# Patient Record
Sex: Female | Born: 1955 | Race: White | Hispanic: No | Marital: Married | Smoking: Never smoker
Health system: Southern US, Community
[De-identification: ages and names within clinical notes are randomized; demographics above are authoritative.]

## PROBLEM LIST (undated history)

## (undated) DIAGNOSIS — R011 Cardiac murmur, unspecified: Secondary | ICD-10-CM

## (undated) DIAGNOSIS — I10 Essential (primary) hypertension: Secondary | ICD-10-CM

## (undated) DIAGNOSIS — G4730 Sleep apnea, unspecified: Secondary | ICD-10-CM

## (undated) DIAGNOSIS — E119 Type 2 diabetes mellitus without complications: Secondary | ICD-10-CM

## (undated) DIAGNOSIS — R7303 Prediabetes: Secondary | ICD-10-CM

## (undated) DIAGNOSIS — K219 Gastro-esophageal reflux disease without esophagitis: Secondary | ICD-10-CM

## (undated) HISTORY — DX: Essential (primary) hypertension: I10

## (undated) HISTORY — PX: WISDOM TOOTH EXTRACTION: SHX21

## (undated) HISTORY — PX: OTHER SURGICAL HISTORY: SHX169

## (undated) HISTORY — PX: ABDOMINAL HYSTERECTOMY: SHX81

## (undated) HISTORY — PX: COLONOSCOPY: SHX174

---

## 1997-11-16 ENCOUNTER — Other Ambulatory Visit: Admission: RE | Admit: 1997-11-16 | Discharge: 1997-11-16 | Payer: Self-pay | Admitting: Obstetrics and Gynecology

## 1998-11-23 ENCOUNTER — Other Ambulatory Visit: Admission: RE | Admit: 1998-11-23 | Discharge: 1998-11-23 | Payer: Self-pay | Admitting: Obstetrics and Gynecology

## 1999-02-16 ENCOUNTER — Inpatient Hospital Stay (HOSPITAL_COMMUNITY): Admission: RE | Admit: 1999-02-16 | Discharge: 1999-02-21 | Payer: Self-pay | Admitting: Obstetrics and Gynecology

## 2000-03-05 ENCOUNTER — Encounter: Payer: Self-pay | Admitting: Obstetrics and Gynecology

## 2000-03-05 ENCOUNTER — Encounter: Admission: RE | Admit: 2000-03-05 | Discharge: 2000-03-05 | Payer: Self-pay | Admitting: Obstetrics and Gynecology

## 2000-03-05 ENCOUNTER — Other Ambulatory Visit: Admission: RE | Admit: 2000-03-05 | Discharge: 2000-03-05 | Payer: Self-pay | Admitting: Obstetrics and Gynecology

## 2001-03-25 ENCOUNTER — Encounter: Admission: RE | Admit: 2001-03-25 | Discharge: 2001-03-25 | Payer: Self-pay | Admitting: Obstetrics and Gynecology

## 2001-03-25 ENCOUNTER — Encounter: Payer: Self-pay | Admitting: Obstetrics and Gynecology

## 2001-03-25 ENCOUNTER — Other Ambulatory Visit: Admission: RE | Admit: 2001-03-25 | Discharge: 2001-03-25 | Payer: Self-pay | Admitting: Obstetrics and Gynecology

## 2002-03-26 ENCOUNTER — Encounter: Payer: Self-pay | Admitting: Obstetrics and Gynecology

## 2002-03-26 ENCOUNTER — Encounter: Admission: RE | Admit: 2002-03-26 | Discharge: 2002-03-26 | Payer: Self-pay | Admitting: Obstetrics and Gynecology

## 2002-04-22 ENCOUNTER — Other Ambulatory Visit: Admission: RE | Admit: 2002-04-22 | Discharge: 2002-04-22 | Payer: Self-pay | Admitting: Obstetrics and Gynecology

## 2003-05-04 ENCOUNTER — Ambulatory Visit (HOSPITAL_BASED_OUTPATIENT_CLINIC_OR_DEPARTMENT_OTHER): Admission: RE | Admit: 2003-05-04 | Discharge: 2003-05-04 | Payer: Self-pay | Admitting: Internal Medicine

## 2003-05-31 ENCOUNTER — Other Ambulatory Visit: Admission: RE | Admit: 2003-05-31 | Discharge: 2003-05-31 | Payer: Self-pay | Admitting: Obstetrics and Gynecology

## 2004-07-27 ENCOUNTER — Other Ambulatory Visit: Admission: RE | Admit: 2004-07-27 | Discharge: 2004-07-27 | Payer: Self-pay | Admitting: Obstetrics and Gynecology

## 2005-08-20 ENCOUNTER — Encounter: Admission: RE | Admit: 2005-08-20 | Discharge: 2005-08-20 | Payer: Self-pay | Admitting: Sports Medicine

## 2005-10-22 ENCOUNTER — Ambulatory Visit (HOSPITAL_COMMUNITY): Admission: RE | Admit: 2005-10-22 | Discharge: 2005-10-23 | Payer: Self-pay | Admitting: Orthopedic Surgery

## 2006-04-26 IMAGING — CR DG CHEST 2V
2 series · 2 of 2 positions shown · non-contrast
Comparison: none

CLINICAL DATA: Hypertension, impingement syndrome. 
 CHEST - 2 VIEW ? 10/17/05:
 The heart size and mediastinal contours are within normal limits.  Both lungs are clear.  The visualized skeletal structures are unremarkable.

[view not recorded (1 of 2)]
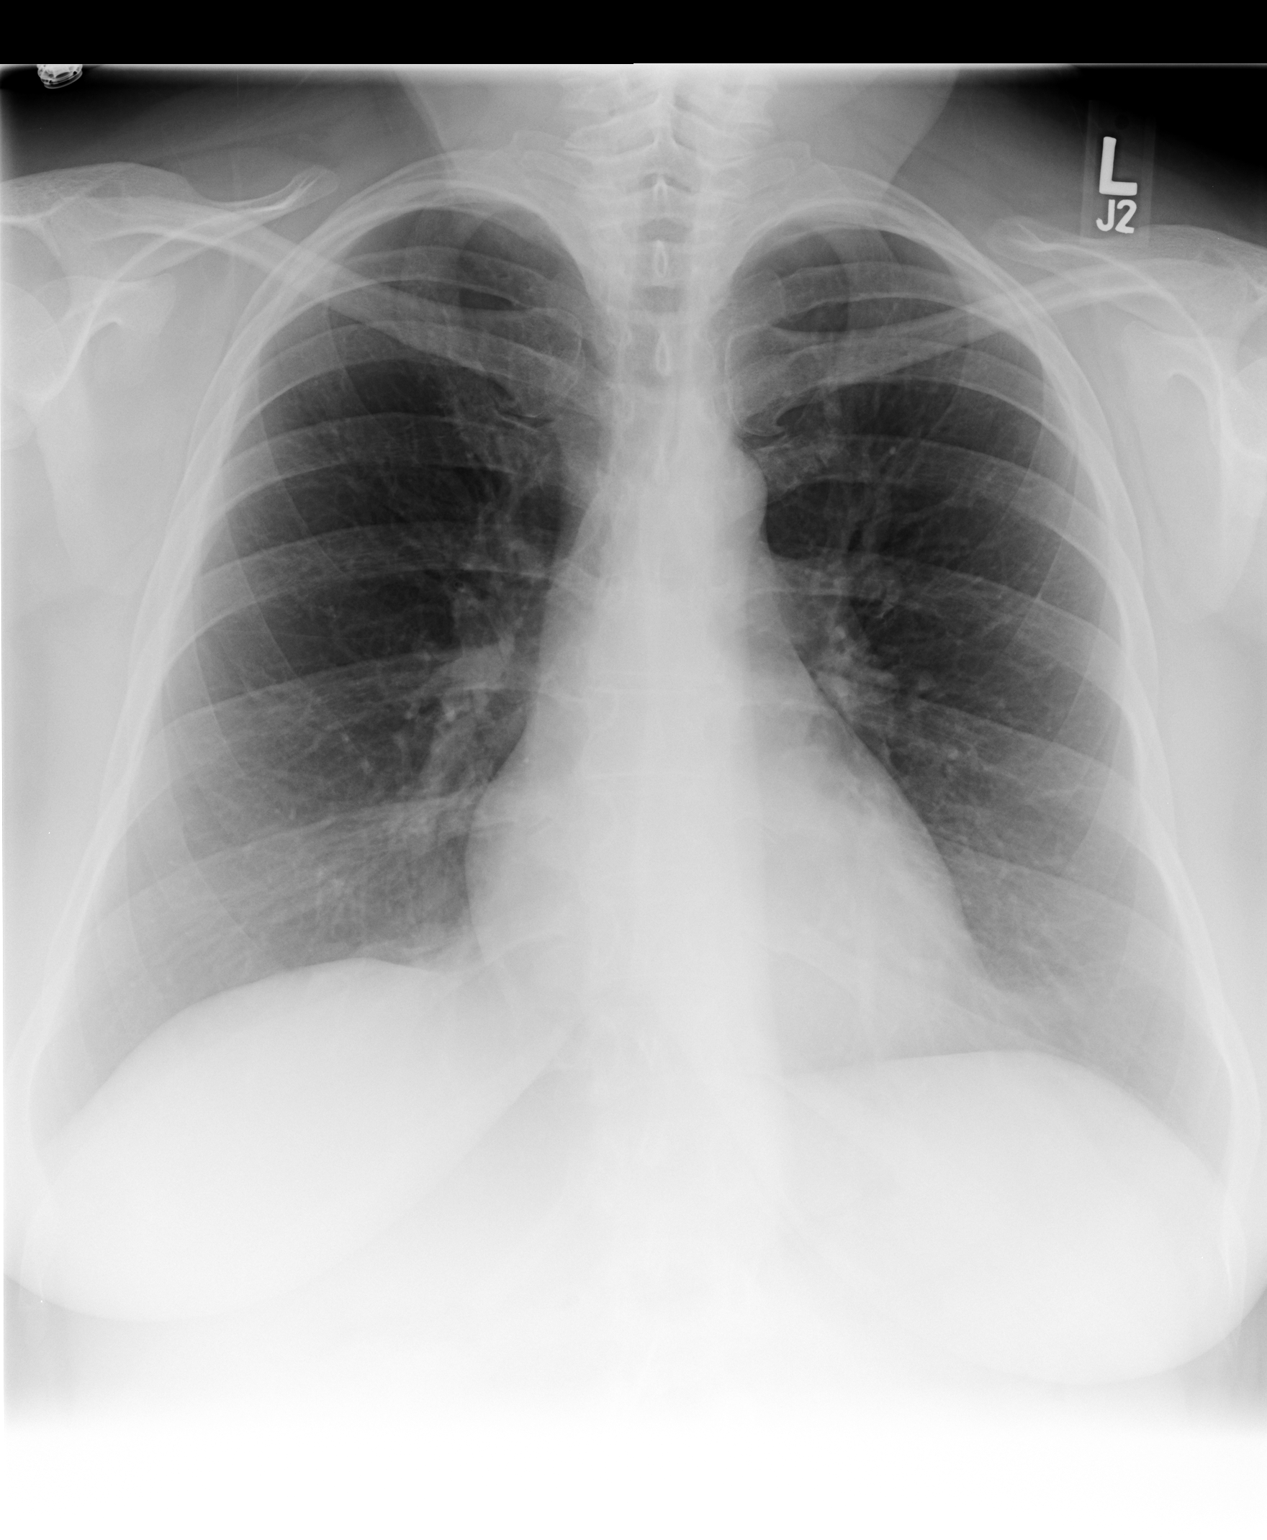

[view not recorded (2 of 2)]
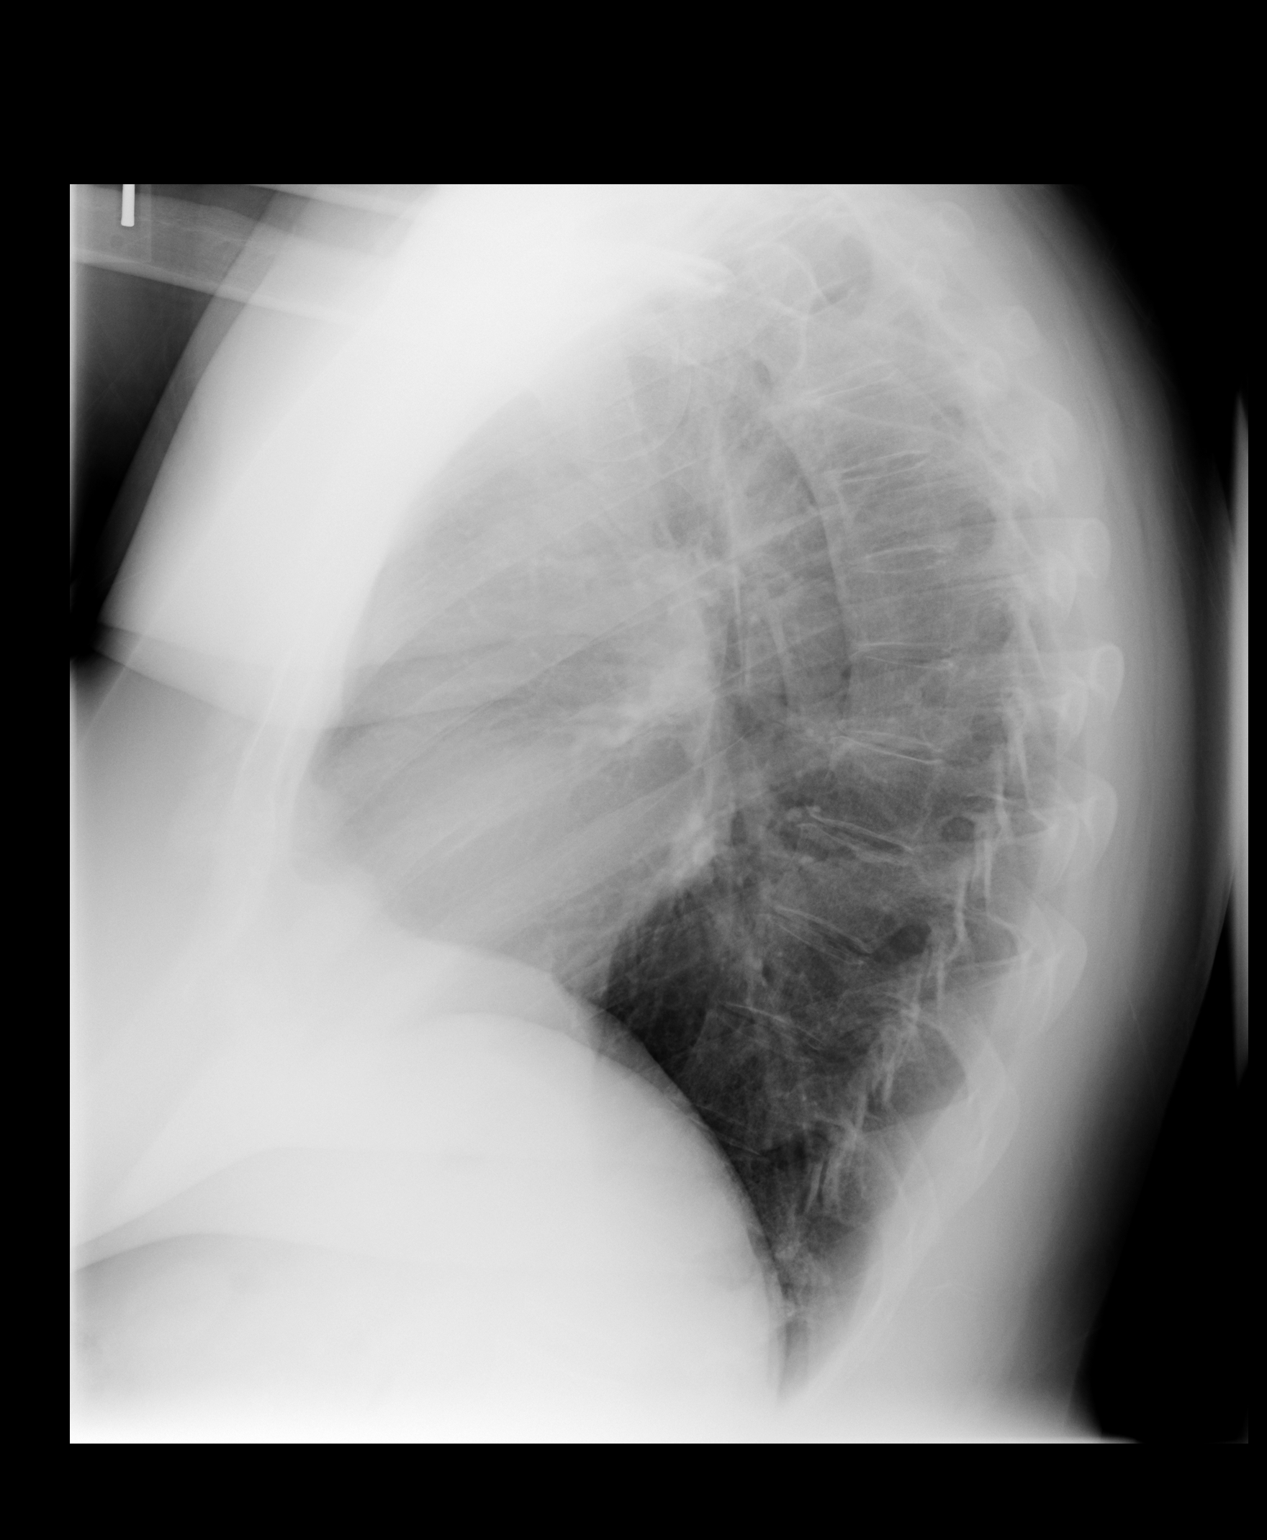

[2 of 2 positions shown; findings below may reference images not displayed]

IMPRESSION: No active cardiopulmonary disease.

## 2013-05-04 ENCOUNTER — Other Ambulatory Visit (HOSPITAL_COMMUNITY): Payer: Self-pay | Admitting: Obstetrics and Gynecology

## 2013-05-04 DIAGNOSIS — R011 Cardiac murmur, unspecified: Secondary | ICD-10-CM

## 2013-05-13 ENCOUNTER — Ambulatory Visit (HOSPITAL_COMMUNITY): Payer: Managed Care, Other (non HMO)

## 2016-05-29 ENCOUNTER — Other Ambulatory Visit: Payer: Self-pay | Admitting: Orthopedic Surgery

## 2016-07-04 ENCOUNTER — Encounter (HOSPITAL_BASED_OUTPATIENT_CLINIC_OR_DEPARTMENT_OTHER): Admission: RE | Payer: Self-pay | Source: Ambulatory Visit

## 2016-07-04 ENCOUNTER — Ambulatory Visit (HOSPITAL_BASED_OUTPATIENT_CLINIC_OR_DEPARTMENT_OTHER)
Admission: RE | Admit: 2016-07-04 | Payer: Managed Care, Other (non HMO) | Source: Ambulatory Visit | Admitting: Orthopedic Surgery

## 2016-07-04 SURGERY — EXCISION MASS
Anesthesia: Choice | Laterality: Left

## 2018-05-25 ENCOUNTER — Other Ambulatory Visit: Payer: Self-pay | Admitting: Psychiatry

## 2018-05-25 DIAGNOSIS — F900 Attention-deficit hyperactivity disorder, predominantly inattentive type: Secondary | ICD-10-CM

## 2018-05-25 MED ORDER — AMPHETAMINE-DEXTROAMPHET ER 25 MG PO CP24: ORAL_CAPSULE | ORAL | 0 refills | Status: DC

## 2018-05-25 NOTE — Progress Notes (Signed)
Adderall xr refill

## 2018-05-26 ENCOUNTER — Encounter: Payer: Self-pay | Admitting: Emergency Medicine

## 2018-05-26 DIAGNOSIS — F909 Attention-deficit hyperactivity disorder, unspecified type: Secondary | ICD-10-CM | POA: Insufficient documentation

## 2018-07-15 ENCOUNTER — Ambulatory Visit (INDEPENDENT_AMBULATORY_CARE_PROVIDER_SITE_OTHER): Payer: Managed Care, Other (non HMO) | Admitting: Psychiatry

## 2018-07-15 ENCOUNTER — Encounter: Payer: Self-pay | Admitting: Psychiatry

## 2018-07-15 VITALS — BP 197/78 | HR 86

## 2018-07-15 DIAGNOSIS — F3342 Major depressive disorder, recurrent, in full remission: Secondary | ICD-10-CM | POA: Diagnosis not present

## 2018-07-15 DIAGNOSIS — F900 Attention-deficit hyperactivity disorder, predominantly inattentive type: Secondary | ICD-10-CM | POA: Diagnosis not present

## 2018-07-15 MED ORDER — AMPHETAMINE-DEXTROAMPHET ER 25 MG PO CP24: ORAL_CAPSULE | ORAL | 0 refills | Status: DC

## 2018-07-15 MED ORDER — BUPROPION HCL ER (XL) 300 MG PO TB24: 300.0000 mg | ORAL_TABLET | ORAL | 1 refills | Status: DC

## 2018-07-15 MED ORDER — SERTRALINE HCL 25 MG PO TABS: 25.0000 mg | ORAL_TABLET | Freq: Every day | ORAL | 3 refills | Status: DC

## 2018-07-15 NOTE — Progress Notes (Signed)
Taylor Oconnell [id number] 01/08/1956 63 y.o.  Subjective:   Patient ID:  Taylor Oconnell is a 63 y.o. (DOB 05/16/1956) female.  Chief Complaint:  Chief Complaint  Patient presents with  . Follow-up    Medication Management    HPI  Last seen July 24 Taylor Oconnell presents to the office today for follow-up of ADD and depression.  "I think I've been fine"  But family complains vaguely about things like she's slow.  Do get side-tracked and is chronically slow too.  Taylor Oconnell says she's different perhaps less attentive.  Patient doesn't notice much change.  She doesn't have a schedule and that contributes to lower productivity.  Compliant with meds.  Recognizes she has lost hearing and that annoys family.  Of note last med change was added sertraline 25 mg in February for excessive tearfulness.    She's retired for 2 1/2 years. .   Review of Systems:  Review of Systems  Neurological: Negative for tremors and weakness.  Psychiatric/Behavioral: Positive for decreased concentration. Negative for agitation, behavioral problems, confusion, dysphoric mood, hallucinations, self-injury, sleep disturbance and suicidal ideas. The patient is not nervous/anxious and is not hyperactive.     Medications: I have reviewed the patient's current medications.  Current Outpatient Medications  Medication Sig Dispense Refill  . amphetamine-dextroamphetamine (ADDERALL XR) 25 MG 24 hr capsule 2 in the morning and 1 at noon 90 capsule 0  . Ascorbic Acid (VITAMIN C CR) 1000 MG TBCR Take by mouth.    Marland Kitchen buPROPion (WELLBUTRIN XL) 300 MG 24 hr tablet Take 300 mg by mouth every morning.    . Cetirizine HCl (ZYRTEC ALLERGY PO) Take by mouth.    . hydrochlorothiazide (HYDRODIURIL) 25 MG tablet     . pantoprazole (PROTONIX) 40 MG tablet     . ramipril (ALTACE) 5 MG capsule     . sertraline (ZOLOFT) 50 MG tablet Take 50 mg by mouth daily. 1/2 qd x 14 days, then 1 qd     No current facility-administered medications  for this visit.     Medication Side Effects: None  Allergies:  Allergies  Allergen Reactions  . Sulfamethoxazole Swelling    History reviewed. No pertinent past medical history.  Family History  Problem Relation Age of Onset  . Bipolar disorder Paternal Grandmother     Social History   Socioeconomic History  . Marital status: Married    Spouse name: Not on file  . Number of children: Not on file  . Years of education: Not on file  . Highest education level: Not on file  Occupational History  . Not on file  Social Needs  . Financial resource strain: Not on file  . Food insecurity:    Worry: Not on file    Inability: Not on file  . Transportation needs:    Medical: Not on file    Non-medical: Not on file  Tobacco Use  . Smoking status: Never Smoker  . Smokeless tobacco: Never Used  Substance and Sexual Activity  . Alcohol use: Not Currently  . Drug use: Never  . Sexual activity: Yes    Partners: Male    Birth control/protection: None  Lifestyle  . Physical activity:    Days per week: Not on file    Minutes per session: Not on file  . Stress: Not on file  Relationships  . Social connections:    Talks on phone: Not on file    Gets together: Not on file  Attends religious service: Not on file    Active member of club or organization: Not on file    Attends meetings of clubs or organizations: Not on file    Relationship status: Not on file  . Intimate partner violence:    Fear of current or ex partner: Not on file    Emotionally abused: Not on file    Physically abused: Not on file    Forced sexual activity: Not on file  Other Topics Concern  . Not on file  Social History Narrative  . Not on file    Past Medical History, Surgical history, Social history, and Family history were reviewed and updated as appropriate.   Please see review of systems for further details on the patient's review from today.   Objective:   Physical Exam:  BP (!) 197/78    Pulse 86   Physical Exam Constitutional:      General: She is not in acute distress.    Appearance: She is well-developed.  Musculoskeletal:        General: No deformity.  Neurological:     Mental Status: She is alert and oriented to person, place, and time.     Motor: No tremor.     Coordination: Coordination normal.     Gait: Gait normal.  Psychiatric:        Attention and Perception: Attention and perception normal.        Mood and Affect: Mood is not anxious or depressed. Affect is not labile, blunt, angry or inappropriate.        Speech: Speech normal.        Behavior: Behavior normal.        Thought Content: Thought content normal. Thought content does not include homicidal or suicidal ideation. Thought content does not include homicidal or suicidal plan.        Cognition and Memory: Cognition normal.        Judgment: Judgment normal.     Comments: Insight intact. No auditory or visual hallucinations. No delusions.      Lab Review:  No results found for: NA, K, CL, CO2, GLUCOSE, BUN, CREATININE, CALCIUM, PROT, ALBUMIN, AST, ALT, ALKPHOS, BILITOT, GFRNONAA, GFRAA  No results found for: WBC, RBC, HGB, HCT, PLT, MCV, MCH, MCHC, RDW, LYMPHSABS, MONOABS, EOSABS, BASOSABS  No results found for: POCLITH, LITHIUM   No results found for: PHENYTOIN, PHENOBARB, VALPROATE, CBMZ   .res Assessment: Plan:    Attention deficit hyperactivity disorder (ADHD), predominantly inattentive type  Recurrent major depression in full remission ([place])   Chronic ADD only marginally controlled but on max meds.   Has "white coat" hypertension per her usual. Discussed potential benefits, risks, and side effects of stimulants with patient to include increased heart rate, palpitations, insomnia, increased anxiety, increased irritability, or decreased appetite.  Instructed patient to contact office if experiencing any significant tolerability issues.  Get hearing checked and aids for mental health  reasons.  Excessive tearfulness helped with 25 mg sertraline.  Continue meds without changing.  FU 6 mo or sooner  Lynder Parents, MD, DFAPA   Lynder Parents, MD, DFAPA   Please see After Visit Summary for patient specific instructions.  No future appointments.  No orders of the defined types were placed in this encounter.     -------------------------------

## 2018-10-22 ENCOUNTER — Other Ambulatory Visit: Payer: Self-pay

## 2018-10-22 DIAGNOSIS — F900 Attention-deficit hyperactivity disorder, predominantly inattentive type: Secondary | ICD-10-CM

## 2018-10-22 MED ORDER — AMPHETAMINE-DEXTROAMPHET ER 25 MG PO CP24: ORAL_CAPSULE | ORAL | 0 refills | Status: DC

## 2018-10-22 NOTE — Telephone Encounter (Signed)
Perfect.  I hope the trick was helpful. Thanks.

## 2019-01-13 ENCOUNTER — Ambulatory Visit: Payer: Managed Care, Other (non HMO) | Admitting: Psychiatry

## 2019-01-21 ENCOUNTER — Other Ambulatory Visit: Payer: Self-pay | Admitting: Psychiatry

## 2019-01-21 DIAGNOSIS — F900 Attention-deficit hyperactivity disorder, predominantly inattentive type: Secondary | ICD-10-CM

## 2019-01-21 NOTE — Telephone Encounter (Signed)
Can you submit please  Last fill 12/21/2018 #[id number]  Has appt 01/27/2019 with [facility] Last appt 07/2018

## 2019-01-27 ENCOUNTER — Other Ambulatory Visit: Payer: Self-pay

## 2019-01-27 ENCOUNTER — Ambulatory Visit (INDEPENDENT_AMBULATORY_CARE_PROVIDER_SITE_OTHER): Payer: 59 | Admitting: Psychiatry

## 2019-01-27 ENCOUNTER — Encounter: Payer: Self-pay | Admitting: Psychiatry

## 2019-01-27 VITALS — BP 170/110 | HR 75

## 2019-01-27 DIAGNOSIS — F3342 Major depressive disorder, recurrent, in full remission: Secondary | ICD-10-CM | POA: Diagnosis not present

## 2019-01-27 DIAGNOSIS — F900 Attention-deficit hyperactivity disorder, predominantly inattentive type: Secondary | ICD-10-CM | POA: Diagnosis not present

## 2019-01-27 MED ORDER — SERTRALINE HCL 25 MG PO TABS: 25.0000 mg | ORAL_TABLET | Freq: Every day | ORAL | 3 refills | Status: DC

## 2019-01-27 MED ORDER — AMPHETAMINE-DEXTROAMPHET ER 25 MG PO CP24: ORAL_CAPSULE | ORAL | 0 refills | Status: DC

## 2019-01-27 MED ORDER — BUPROPION HCL ER (XL) 300 MG PO TB24: 300.0000 mg | ORAL_TABLET | ORAL | 3 refills | Status: DC

## 2019-01-27 NOTE — Progress Notes (Signed)
Taylor Oconnell [id number] 06-Feb-1956 63 y.o.  Subjective:   Patient ID:  Taylor Oconnell is a 63 y.o. (DOB 1956-01-21) female.  Chief Complaint:  Chief Complaint  Patient presents with  . ADHD    Medication Management  . Follow-up    Medication Management    HPI    Taylor Oconnell presents to the office today for follow-up of ADD and depression.  Last seen January 2020. No med changes.  Still doing well.  It helps that I'm a home-body re: Covid.  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  "I think I've been fine"  But family complains vaguely about things like she's slow.  Do get side-tracked and is chronically slow too.  [name] says she's different perhaps less attentive.  Patient doesn't notice much change.  She doesn't have a schedule and that contributes to lower productivity.  Compliant with meds.  Satisfied with meds.  Patient reports stable mood and denies depressed or irritable moods.  Patient denies any recent difficulty with anxiety.  Patient denies difficulty with sleep initiation or maintenance. Denies appetite disturbance.  Patient reports that energy and motivation have been good.  Patient denies any difficulty with concentration.  Patient denies any suicidal ideation.  [name]'s health is stable.  She still feels he needs to be on meds.  Get's irritable and angry to easily.  Recognizes she has lost hearing and that annoys family.  Of note last med change was added sertraline 25 mg in February 2019 for excessive tearfulness.  It helped.  She's retired for 2017. Loves it..  PE in June.  White coat elevation in BP.  Past Psychiatric Medication Trials: Wellbutrin XL 300, Adderall XR 75 daily, sertraline .   Review of Systems:  Review of Systems  Neurological: Negative for tremors and weakness.  Psychiatric/Behavioral: Positive for decreased concentration. Negative for agitation, behavioral problems, confusion, dysphoric mood, hallucinations, self-injury,  sleep disturbance and suicidal ideas. The patient is not nervous/anxious and is not hyperactive.     Medications: I have reviewed the patient's current medications.  Current Outpatient Medications  Medication Sig Dispense Refill  . amphetamine-dextroamphetamine (ADDERALL XR) 25 MG 24 hr capsule 2 in the AM and 1 In PM 90 capsule 0  . [START ON 02/24/2019] amphetamine-dextroamphetamine (ADDERALL XR) 25 MG 24 hr capsule 2 in the AM and 1 In PM 90 capsule 0  . [START ON 03/24/2019] amphetamine-dextroamphetamine (ADDERALL XR) 25 MG 24 hr capsule TAKE 2 CAPSULES BY MOUTH EVERY DAY IN THE MORNING AND TAKE ONE CAPSULE at noon 90 capsule 0  . Ascorbic Acid (VITAMIN C CR) 1000 MG TBCR Take by mouth.    Marland Kitchen buPROPion (WELLBUTRIN XL) 300 MG 24 hr tablet Take 1 tablet (300 mg total) by mouth every morning. 90 tablet 3  . Cetirizine HCl (ZYRTEC ALLERGY PO) Take by mouth.    . hydrochlorothiazide (HYDRODIURIL) 25 MG tablet     . pantoprazole (PROTONIX) 40 MG tablet     . ramipril (ALTACE) 5 MG capsule     . sertraline (ZOLOFT) 25 MG tablet Take 1 tablet (25 mg total) by mouth daily. 90 tablet 3   No current facility-administered medications for this visit.     Medication Side Effects: None  Allergies:  Allergies  Allergen Reactions  . Sulfamethoxazole Swelling    History reviewed. No pertinent past medical history.  Family History  Problem Relation Age of Onset  . Bipolar disorder Paternal Grandmother     Social History  Socioeconomic History  . Marital status: Married    Spouse name: Not on file  . Number of children: Not on file  . Years of education: Not on file  . Highest education level: Not on file  Occupational History  . Not on file  Social Needs  . Financial resource strain: Not on file  . Food insecurity    Worry: Not on file    Inability: Not on file  . Transportation needs    Medical: Not on file    Non-medical: Not on file  Tobacco Use  . Smoking status: Never  Smoker  . Smokeless tobacco: Never Used  Substance and Sexual Activity  . Alcohol use: Not Currently  . Drug use: Never  . Sexual activity: Yes    Partners: Male    Birth control/protection: None  Lifestyle  . Physical activity    Days per week: Not on file    Minutes per session: Not on file  . Stress: Not on file  Relationships  . Social Herbalist on phone: Not on file    Gets together: Not on file    Attends religious service: Not on file    Active member of club or organization: Not on file    Attends meetings of clubs or organizations: Not on file    Relationship status: Not on file  . Intimate partner violence    Fear of current or ex partner: Not on file    Emotionally abused: Not on file    Physically abused: Not on file    Forced sexual activity: Not on file  Other Topics Concern  . Not on file  Social History Narrative  . Not on file    Past Medical History, Surgical history, Social history, and Family history were reviewed and updated as appropriate.   Please see review of systems for further details on the patient's review from today.   Objective:   Physical Exam:  BP (!) 170/110   Pulse 75   Physical Exam Constitutional:      General: She is not in acute distress.    Appearance: She is well-developed.  Musculoskeletal:        General: No deformity.  Neurological:     Mental Status: She is alert and oriented to person, place, and time.     Motor: No tremor.     Coordination: Coordination normal.     Gait: Gait normal.  Psychiatric:        Attention and Perception: Attention and perception normal.        Mood and Affect: Mood is not anxious or depressed. Affect is not labile, blunt, angry or inappropriate.        Speech: Speech normal.        Behavior: Behavior normal.        Thought Content: Thought content normal. Thought content does not include homicidal or suicidal ideation. Thought content does not include homicidal or suicidal  plan.        Cognition and Memory: Cognition normal.        Judgment: Judgment normal.     Comments: Insight intact. No auditory or visual hallucinations. No delusions.      Lab Review:  No results found for: NA, K, CL, CO2, GLUCOSE, BUN, CREATININE, CALCIUM, PROT, ALBUMIN, AST, ALT, ALKPHOS, BILITOT, GFRNONAA, GFRAA  No results found for: WBC, RBC, HGB, HCT, PLT, MCV, MCH, MCHC, RDW, LYMPHSABS, MONOABS, EOSABS, BASOSABS  No results found for: POCLITH,  LITHIUM   No results found for: PHENYTOIN, PHENOBARB, VALPROATE, CBMZ   .res Assessment: Plan:    Analya was seen today for adhd and follow-up.  Diagnoses and all orders for this visit:  Recurrent major depression in full remission ([place]) -     buPROPion (WELLBUTRIN XL) 300 MG 24 hr tablet; Take 1 tablet (300 mg total) by mouth every morning.  Attention deficit hyperactivity disorder (ADHD), predominantly inattentive type -     amphetamine-dextroamphetamine (ADDERALL XR) 25 MG 24 hr capsule; 2 in the AM and 1 In PM -     amphetamine-dextroamphetamine (ADDERALL XR) 25 MG 24 hr capsule; 2 in the AM and 1 In PM -     amphetamine-dextroamphetamine (ADDERALL XR) 25 MG 24 hr capsule; TAKE 2 CAPSULES BY MOUTH EVERY DAY IN THE MORNING AND TAKE ONE CAPSULE at noon  Other orders -     sertraline (ZOLOFT) 25 MG tablet; Take 1 tablet (25 mg total) by mouth daily.   Chronic ADD only marginally controlled but on max meds.   Has "white coat" hypertension per her usual. Discussed potential benefits, risks, and side effects of stimulants with patient to include increased heart rate, palpitations, insomnia, increased anxiety, increased irritability, or decreased appetite.  Instructed patient to contact office if experiencing any significant tolerability issues.  Get hearing checked and aids for mental health reasons.  Excessive tearfulness helped with 25 mg sertraline.  Continue meds without changing.  FU 6 mo or sooner  Lynder Parents, MD,  DFAPA   Lynder Parents, MD, DFAPA   Please see After Visit Summary for patient specific instructions.  No future appointments.  No orders of the defined types were placed in this encounter.     -------------------------------

## 2019-02-22 ENCOUNTER — Other Ambulatory Visit: Payer: Self-pay | Admitting: Psychiatry

## 2019-02-22 DIAGNOSIS — F900 Attention-deficit hyperactivity disorder, predominantly inattentive type: Secondary | ICD-10-CM

## 2019-02-23 ENCOUNTER — Telehealth: Payer: Self-pay

## 2019-02-23 NOTE — Telephone Encounter (Signed)
Prior authorization submitted and approved for amphetamine-dextroamphetamine XR 25 mg 3/day through covermymeds with [employer] effective 02/23/2019-02/22/2022

## 2019-03-22 ENCOUNTER — Other Ambulatory Visit: Payer: Self-pay | Admitting: Psychiatry

## 2019-03-22 DIAGNOSIS — F900 Attention-deficit hyperactivity disorder, predominantly inattentive type: Secondary | ICD-10-CM

## 2019-04-22 ENCOUNTER — Other Ambulatory Visit: Payer: Self-pay | Admitting: Psychiatry

## 2019-04-22 DIAGNOSIS — F900 Attention-deficit hyperactivity disorder, predominantly inattentive type: Secondary | ICD-10-CM

## 2019-04-22 NOTE — Telephone Encounter (Signed)
07/2019 due back

## 2019-07-29 ENCOUNTER — Ambulatory Visit (INDEPENDENT_AMBULATORY_CARE_PROVIDER_SITE_OTHER): Payer: 59 | Admitting: Psychiatry

## 2019-07-29 ENCOUNTER — Other Ambulatory Visit: Payer: Self-pay

## 2019-07-29 ENCOUNTER — Encounter: Payer: Self-pay | Admitting: Psychiatry

## 2019-07-29 VITALS — BP 145/92 | HR 85

## 2019-07-29 DIAGNOSIS — F3342 Major depressive disorder, recurrent, in full remission: Secondary | ICD-10-CM

## 2019-07-29 DIAGNOSIS — F900 Attention-deficit hyperactivity disorder, predominantly inattentive type: Secondary | ICD-10-CM | POA: Diagnosis not present

## 2019-07-29 MED ORDER — AMPHETAMINE-DEXTROAMPHET ER 25 MG PO CP24: ORAL_CAPSULE | ORAL | 0 refills | Status: DC

## 2019-07-29 NOTE — Progress Notes (Addendum)
[name] [id number] 1955-08-20 64 y.o.  Subjective:   Patient ID:  Taylor Oconnell is a 64 y.o. (DOB [birth year]) female.  Chief Complaint:  Chief Complaint  Patient presents with  . Follow-up    Medication Management  . Depression    Medication Management  . ADHD    Medication Management    Depression        Associated symptoms include decreased concentration.  Associated symptoms include no suicidal ideas.     Taylor Oconnell presents to the office today for follow-up of ADD and depression.  Last seen July 2020. No med changes.  Still doing well.  It helps that I'm a home-body re: Covid. Still handles isolation fine.  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   "I think I've been fine"  But family complains vaguely about things like she's slow.  Do get side-tracked and is chronically slow too.  Taylor Oconnell says she's different perhaps less attentive.  Patient doesn't notice much change.  She doesn't have a schedule and that contributes to lower productivity.  Compliant with meds.  Satisfied with meds.  Patient reports stable mood and denies depressed or irritable moods.  Patient denies any recent difficulty with anxiety.  Patient denies difficulty with sleep initiation or maintenance. Denies appetite disturbance.  Patient reports that energy and motivation have been good.  Patient denies any difficulty with concentration.  Patient denies any suicidal ideation.  Taylor Oconnell's health is stable.  She still feels he needs to be on meds.  Get's irritable and angry to easily.   Pleased with 12# weight loss in a year. Recognizes she has lost hearing and that annoys family.  Of note last med change was added sertraline 25 mg in February 2019 for excessive tearfulness.  It helped.  When Taylor Oconnell is hateful can cry.    She's retired for 2017. Loves it..  PE in June.  White coat elevation in BP.  Past Psychiatric Medication Trials: Wellbutrin XL  300, Adderall XR 75 daily, sertraline .   Review of Systems:  Review of Systems  Neurological: Negative for tremors and weakness.  Psychiatric/Behavioral: Positive for decreased concentration and depression. Negative for agitation, behavioral problems, confusion, dysphoric mood, hallucinations, self-injury, sleep disturbance and suicidal ideas. The patient is not nervous/anxious and is not hyperactive.     Medications: I have reviewed the patient's current medications.  Current Outpatient Medications  Medication Sig Dispense Refill  . amphetamine-dextroamphetamine (ADDERALL XR) 25 MG 24 hr capsule 2 in the AM and 1 In PM 90 capsule 0  . [START ON 08/26/2019] amphetamine-dextroamphetamine (ADDERALL XR) 25 MG 24 hr capsule 2 in the AM and 1 In PM 90 capsule 0  . [START ON 09/23/2019] amphetamine-dextroamphetamine (ADDERALL XR) 25 MG 24 hr capsule TAKE 2 CAPSULES BY MOUTH EVERY DAY IN THE MORNING AND TAKE ONE CAPSULE AT NOON 90 capsule 0  . Ascorbic Acid (VITAMIN C CR) 1000 MG TBCR Take by mouth.    Marland Kitchen buPROPion (WELLBUTRIN XL) 300 MG 24 hr tablet Take 1 tablet (300 mg total) by mouth every morning. 90 tablet 3  . Cetirizine HCl (ZYRTEC ALLERGY PO) Take by mouth.    . hydrochlorothiazide (HYDRODIURIL) 25 MG tablet     . oxybutynin (DITROPAN-XL) 5 MG 24 hr tablet Take 5 mg by mouth daily.    . ramipril (ALTACE) 5 MG capsule 10 mg.     . sertraline (ZOLOFT) 25 MG tablet Take 1 tablet (  25 mg total) by mouth daily. 90 tablet 3   No current facility-administered medications for this visit.    Medication Side Effects: None  Allergies:  Allergies  Allergen Reactions  . Sulfamethoxazole Swelling    History reviewed. No pertinent past medical history.  Family History  Problem Relation Age of Onset  . Bipolar disorder Paternal Grandmother     Social History   Socioeconomic History  . Marital status: Married    Spouse name: Not on file  . Number of children: Not on file  . Years of  education: Not on file  . Highest education level: Not on file  Occupational History  . Not on file  Tobacco Use  . Smoking status: Never Smoker  . Smokeless tobacco: Never Used  Substance and Sexual Activity  . Alcohol use: Not Currently  . Drug use: Never  . Sexual activity: Yes    Partners: Male    Birth control/protection: None  Other Topics Concern  . Not on file  Social History Narrative  . Not on file   Social Determinants of Health   Financial Resource Strain:   . Difficulty of Paying Living Expenses: Not on file  Food Insecurity:   . Worried About Charity fundraiser in the Last Year: Not on file  . Ran Out of Food in the Last Year: Not on file  Transportation Needs:   . Lack of Transportation (Medical): Not on file  . Lack of Transportation (Non-Medical): Not on file  Physical Activity:   . Days of Exercise per Week: Not on file  . Minutes of Exercise per Session: Not on file  Stress:   . Feeling of Stress : Not on file  Social Connections:   . Frequency of Communication with Friends and Family: Not on file  . Frequency of Social Gatherings with Friends and Family: Not on file  . Attends Religious Services: Not on file  . Active Member of Clubs or Organizations: Not on file  . Attends Archivist Meetings: Not on file  . Marital Status: Not on file  Intimate Partner Violence:   . Fear of Current or Ex-Partner: Not on file  . Emotionally Abused: Not on file  . Physically Abused: Not on file  . Sexually Abused: Not on file    Past Medical History, Surgical history, Social history, and Family history were reviewed and updated as appropriate.   Please see review of systems for further details on the patient's review from today.   Objective:   Physical Exam:  BP (!) 145/92   Pulse 85   Physical Exam Constitutional:      General: She is not in acute distress.    Appearance: She is well-developed.  Musculoskeletal:        General: No  deformity.  Neurological:     Mental Status: She is alert and oriented to person, place, and time.     Motor: No tremor.     Coordination: Coordination normal.     Gait: Gait normal.  Psychiatric:        Attention and Perception: Attention and perception normal.        Mood and Affect: Mood is not anxious or depressed. Affect is not labile, blunt, angry or inappropriate.        Speech: Speech normal.        Behavior: Behavior normal.        Thought Content: Thought content normal. Thought content does not include homicidal or  suicidal ideation. Thought content does not include homicidal or suicidal plan.        Cognition and Memory: Cognition normal.        Judgment: Judgment normal.     Comments: Insight intact. No auditory or visual hallucinations. No delusions.      Lab Review:  No results found for: NA, K, CL, CO2, GLUCOSE, BUN, CREATININE, CALCIUM, PROT, ALBUMIN, AST, ALT, ALKPHOS, BILITOT, GFRNONAA, GFRAA  No results found for: WBC, RBC, HGB, HCT, PLT, MCV, MCH, MCHC, RDW, LYMPHSABS, MONOABS, EOSABS, BASOSABS  No results found for: POCLITH, LITHIUM   No results found for: PHENYTOIN, PHENOBARB, VALPROATE, CBMZ   .res Assessment: Plan:    Illene was seen today for follow-up, depression and adhd.  Diagnoses and all orders for this visit:  Recurrent major depression in full remission ([place])  Attention deficit hyperactivity disorder (ADHD), predominantly inattentive type -     amphetamine-dextroamphetamine (ADDERALL XR) 25 MG 24 hr capsule; 2 in the AM and 1 In PM -     amphetamine-dextroamphetamine (ADDERALL XR) 25 MG 24 hr capsule; 2 in the AM and 1 In PM -     amphetamine-dextroamphetamine (ADDERALL XR) 25 MG 24 hr capsule; TAKE 2 CAPSULES BY MOUTH EVERY DAY IN THE MORNING AND TAKE ONE CAPSULE AT NOON   Chronic ADD only marginally controlled but on max meds.  And is retired now so less demands.  Has "white coat" hypertension per her usual. Discussed potential benefits,  risks, and side effects of stimulants with patient to include increased heart rate, palpitations, insomnia, increased anxiety, increased irritability, or decreased appetite.  Instructed patient to contact office if experiencing any significant tolerability issues.  Got hearing checked and aids for mental health reasons.  It's helped.  Excessive tearfulness helped with 25 mg sertraline.  Continue meds without changing. Adderall XR 75 daily Wellbutrin XL 300 AM Sertraline 25 daily  FU 6 mo or sooner  Lynder Parents, MD, DFAPA   Lynder Parents, MD, DFAPA   Please see After Visit Summary for patient specific instructions.  No future appointments.  No orders of the defined types were placed in this encounter.     -------------------------------

## 2019-10-18 ENCOUNTER — Other Ambulatory Visit: Payer: Self-pay | Admitting: Psychiatry

## 2019-10-18 DIAGNOSIS — F900 Attention-deficit hyperactivity disorder, predominantly inattentive type: Secondary | ICD-10-CM

## 2019-10-18 NOTE — Telephone Encounter (Signed)
Refill due 04/17 Patient follow up in July

## 2019-11-17 ENCOUNTER — Other Ambulatory Visit: Payer: Self-pay | Admitting: Psychiatry

## 2019-11-17 DIAGNOSIS — F900 Attention-deficit hyperactivity disorder, predominantly inattentive type: Secondary | ICD-10-CM

## 2019-11-17 NOTE — Telephone Encounter (Signed)
Next apt 07/21

## 2019-12-17 ENCOUNTER — Other Ambulatory Visit: Payer: Self-pay | Admitting: Psychiatry

## 2019-12-17 DIAGNOSIS — F900 Attention-deficit hyperactivity disorder, predominantly inattentive type: Secondary | ICD-10-CM

## 2019-12-17 NOTE — Telephone Encounter (Signed)
Next apt 07/21

## 2020-01-14 ENCOUNTER — Other Ambulatory Visit: Payer: Self-pay | Admitting: Psychiatry

## 2020-01-14 DIAGNOSIS — F900 Attention-deficit hyperactivity disorder, predominantly inattentive type: Secondary | ICD-10-CM

## 2020-01-14 NOTE — Telephone Encounter (Signed)
Next apt 01/26/2020 with [facility]

## 2020-01-26 ENCOUNTER — Ambulatory Visit (INDEPENDENT_AMBULATORY_CARE_PROVIDER_SITE_OTHER): Payer: 59 | Admitting: Psychiatry

## 2020-01-26 ENCOUNTER — Encounter: Payer: Self-pay | Admitting: Psychiatry

## 2020-01-26 ENCOUNTER — Ambulatory Visit: Payer: 59 | Admitting: Psychiatry

## 2020-01-26 ENCOUNTER — Other Ambulatory Visit: Payer: Self-pay

## 2020-01-26 DIAGNOSIS — F900 Attention-deficit hyperactivity disorder, predominantly inattentive type: Secondary | ICD-10-CM | POA: Diagnosis not present

## 2020-01-26 DIAGNOSIS — F3342 Major depressive disorder, recurrent, in full remission: Secondary | ICD-10-CM | POA: Diagnosis not present

## 2020-01-26 MED ORDER — AMPHETAMINE-DEXTROAMPHET ER 25 MG PO CP24: ORAL_CAPSULE | ORAL | 0 refills | Status: DC

## 2020-01-26 MED ORDER — SERTRALINE HCL 25 MG PO TABS: 25.0000 mg | ORAL_TABLET | Freq: Every day | ORAL | 3 refills | Status: DC

## 2020-01-26 MED ORDER — BUPROPION HCL ER (XL) 300 MG PO TB24: 300.0000 mg | ORAL_TABLET | ORAL | 3 refills | Status: DC

## 2020-01-26 NOTE — Progress Notes (Signed)
Taylor Oconnell [id number] 02/08/1956 64 y.o.  Subjective:   Patient ID:  Taylor Oconnell is a 64 y.o. (DOB [birth year]) female.  Chief Complaint:  No chief complaint on file.   Depression        Associated symptoms include decreased concentration.  Associated symptoms include no suicidal ideas.     Taylor Oconnell presents to the office today for follow-up of ADD and depression.  Last seen Jan 2021. No med changes. On the following meds: Continue meds without changing. Adderall XR 75 daily Wellbutrin XL 300 AM Sertraline 25 daily  01/26/20 appt with the following noted: Retired 4 years.  Stays occupied.   Still doing well.  It helps that I'm a home-body re: Covid. Still handles isolation fine.  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   "I think I've been fine"  But family complains vaguely about things like she's slow.  Do get side-tracked and is chronically slow too.  [name] says she's different perhaps less attentive.  Patient doesn't notice much change.  She doesn't have a schedule and that contributes to lower productivity.  Compliant with meds.  Satisfied with meds.   Patient reports stable mood and denies depressed or irritable moods.  Patient denies any recent difficulty with anxiety.  Patient denies difficulty with sleep initiation or maintenance. Denies appetite disturbance.  Patient reports that energy and motivation have been good.  Patient denies any difficulty with concentration.  Patient denies any suicidal ideation.  [name]'s health is stable.  She still feels he needs to be on meds.  Get's irritable and angry to easily.   Of note last med change was added sertraline 25 mg in February 2019 for excessive tearfulness.  It helped.  When [name] is hateful can cry.    She's retired for 2017. Loves it..  Tendency to white coat syndrome.  PE and increased BP med and she has readings that are within the normal range.  Past  Psychiatric Medication Trials: Wellbutrin XL 300, Adderall XR 75 daily, sertraline .   Review of Systems:  Review of Systems  Neurological: Negative for tremors and weakness.  Psychiatric/Behavioral: Positive for decreased concentration and depression. Negative for agitation, behavioral problems, confusion, dysphoric mood, hallucinations, self-injury, sleep disturbance and suicidal ideas. The patient is not nervous/anxious and is not hyperactive.   Depression under control.  Medications: I have reviewed the patient's current medications.  Current Outpatient Medications  Medication Sig Dispense Refill  . amphetamine-dextroamphetamine (ADDERALL XR) 25 MG 24 hr capsule 2 in the AM and 1 In PM 90 capsule 0  . [START ON 02/23/2020] amphetamine-dextroamphetamine (ADDERALL XR) 25 MG 24 hr capsule 2 in the AM and 1 In PM 90 capsule 0  . [START ON 03/22/2020] amphetamine-dextroamphetamine (ADDERALL XR) 25 MG 24 hr capsule TAKE 2 CAPSULES BY MOUTH EVERY DAY IN THE MORNING AND TAKE ONE CAPSULE AT NOON 90 capsule 0  . Ascorbic Acid (VITAMIN C CR) 1000 MG TBCR Take by mouth.    Marland Kitchen buPROPion (WELLBUTRIN XL) 300 MG 24 hr tablet Take 1 tablet (300 mg total) by mouth every morning. 90 tablet 3  . Cetirizine HCl (ZYRTEC ALLERGY PO) Take by mouth.    . hydrochlorothiazide (HYDRODIURIL) 25 MG tablet     . oxybutynin (DITROPAN-XL) 5 MG 24 hr tablet Take 5 mg by mouth daily.    . ramipril (ALTACE) 5 MG capsule 10 mg.     . sertraline (ZOLOFT) 25 MG  tablet Take 1 tablet (25 mg total) by mouth daily. 90 tablet 3   No current facility-administered medications for this visit.    Medication Side Effects: None  Allergies:  Allergies  Allergen Reactions  . Sulfamethoxazole Swelling    History reviewed. No pertinent past medical history.  Family History  Problem Relation Age of Onset  . Bipolar disorder Paternal Grandmother     Social History   Socioeconomic History  . Marital status: Married    Spouse  name: Not on file  . Number of children: Not on file  . Years of education: Not on file  . Highest education level: Not on file  Occupational History  . Not on file  Tobacco Use  . Smoking status: Never Smoker  . Smokeless tobacco: Never Used  Substance and Sexual Activity  . Alcohol use: Not Currently  . Drug use: Never  . Sexual activity: Yes    Partners: Male    Birth control/protection: None  Other Topics Concern  . Not on file  Social History Narrative  . Not on file   Social Determinants of Health   Financial Resource Strain:   . Difficulty of Paying Living Expenses:   Food Insecurity:   . Worried About Charity fundraiser in the Last Year:   . Arboriculturist in the Last Year:   Transportation Needs:   . Film/video editor (Medical):   Marland Kitchen Lack of Transportation (Non-Medical):   Physical Activity:   . Days of Exercise per Week:   . Minutes of Exercise per Session:   Stress:   . Feeling of Stress :   Social Connections:   . Frequency of Communication with Friends and Family:   . Frequency of Social Gatherings with Friends and Family:   . Attends Religious Services:   . Active Member of Clubs or Organizations:   . Attends Archivist Meetings:   Marland Kitchen Marital Status:   Intimate Partner Violence:   . Fear of Current or Ex-Partner:   . Emotionally Abused:   Marland Kitchen Physically Abused:   . Sexually Abused:     Past Medical History, Surgical history, Social history, and Family history were reviewed and updated as appropriate.   Please see review of systems for further details on the patient's review from today.   Objective:   Physical Exam:  There were no vitals taken for this visit.  Physical Exam Constitutional:      General: She is not in acute distress.    Appearance: She is well-developed.  Musculoskeletal:        General: No deformity.  Neurological:     Mental Status: She is alert and oriented to person, place, and time.     Motor: No tremor.      Coordination: Coordination normal.     Gait: Gait normal.  Psychiatric:        Attention and Perception: Attention and perception normal.        Mood and Affect: Mood is depressed. Mood is not anxious. Affect is not labile, blunt, angry or inappropriate.        Speech: Speech normal.        Behavior: Behavior normal.        Thought Content: Thought content normal. Thought content does not include homicidal or suicidal ideation. Thought content does not include homicidal or suicidal plan.        Cognition and Memory: Cognition normal.        Judgment:  Judgment normal.     Comments: Insight intact. No auditory or visual hallucinations. No delusions.      Lab Review:  No results found for: NA, K, CL, CO2, GLUCOSE, BUN, CREATININE, CALCIUM, PROT, ALBUMIN, AST, ALT, ALKPHOS, BILITOT, GFRNONAA, GFRAA  No results found for: WBC, RBC, HGB, HCT, PLT, MCV, MCH, MCHC, RDW, LYMPHSABS, MONOABS, EOSABS, BASOSABS  No results found for: POCLITH, LITHIUM   No results found for: PHENYTOIN, PHENOBARB, VALPROATE, CBMZ   .res Assessment: Plan:    Diagnoses and all orders for this visit:  Recurrent major depression in full remission ([place]) -     buPROPion (WELLBUTRIN XL) 300 MG 24 hr tablet; Take 1 tablet (300 mg total) by mouth every morning.  Attention deficit hyperactivity disorder (ADHD), predominantly inattentive type -     amphetamine-dextroamphetamine (ADDERALL XR) 25 MG 24 hr capsule; 2 in the AM and 1 In PM -     amphetamine-dextroamphetamine (ADDERALL XR) 25 MG 24 hr capsule; 2 in the AM and 1 In PM -     amphetamine-dextroamphetamine (ADDERALL XR) 25 MG 24 hr capsule; TAKE 2 CAPSULES BY MOUTH EVERY DAY IN THE MORNING AND TAKE ONE CAPSULE AT NOON  Other orders -     sertraline (ZOLOFT) 25 MG tablet; Take 1 tablet (25 mg total) by mouth daily.   Chronic ADD only marginally controlled but on max meds.  And is retired now so less demands.  BP controlled.   Discussed potential  benefits, risks, and side effects of stimulants with patient to include increased heart rate, palpitations, insomnia, increased anxiety, increased irritability, or decreased appetite.  Instructed patient to contact office if experiencing any significant tolerability issues.  Got hearing checked and aids for mental health reasons.  It's helped.  Excessive tearfulness helped with 25 mg sertraline.  Continue meds without changing. Adderall XR 75 daily Wellbutrin XL 300 AM Sertraline 25 daily  Option Adzenys for expense if needed.  FU 6 mo or sooner  Lynder Parents, MD, DFAPA   Lynder Parents, MD, DFAPA   Please see After Visit Summary for patient specific instructions.  No future appointments.  No orders of the defined types were placed in this encounter.     -------------------------------

## 2020-05-01 ENCOUNTER — Ambulatory Visit (INDEPENDENT_AMBULATORY_CARE_PROVIDER_SITE_OTHER): Payer: Managed Care, Other (non HMO) | Admitting: Podiatry

## 2020-05-01 ENCOUNTER — Other Ambulatory Visit: Payer: Self-pay

## 2020-05-01 DIAGNOSIS — Z5329 Procedure and treatment not carried out because of patient's decision for other reasons: Secondary | ICD-10-CM

## 2020-05-01 NOTE — Progress Notes (Signed)
No show for appt. 

## 2020-05-08 ENCOUNTER — Encounter: Payer: Self-pay | Admitting: Podiatry

## 2020-05-08 ENCOUNTER — Ambulatory Visit: Payer: Managed Care, Other (non HMO) | Admitting: Podiatry

## 2020-05-08 ENCOUNTER — Other Ambulatory Visit: Payer: Self-pay

## 2020-05-08 DIAGNOSIS — L603 Nail dystrophy: Secondary | ICD-10-CM

## 2020-05-08 DIAGNOSIS — M79676 Pain in unspecified toe(s): Secondary | ICD-10-CM | POA: Diagnosis not present

## 2020-05-08 NOTE — Progress Notes (Signed)
  Subjective:  Patient ID: Taylor Oconnell, female    DOB: [birth year],  MRN: [id number]  Chief Complaint  Patient presents with  . Nail Problem    i stumped my left big toe about 6 months ago and does have some discoloration to it    65 y.o. female presents with the above complaint. History confirmed with patient  Objective:  Physical Exam: warm, good capillary refill, no trophic changes or ulcerative lesions, normal DP and PT pulses and normal sensory exam. 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 Distal nail lysis.    Assessment:   1. Nail dystrophy   2. Pain around toenail     Plan:  Patient was evaluated and treated and all questions answered.  Nail Dystrophy -Educated on likely traumatic etiology -Nail debrided courtesy  No follow-ups on file.

## 2020-05-12 ENCOUNTER — Other Ambulatory Visit: Payer: Self-pay | Admitting: Psychiatry

## 2020-05-12 DIAGNOSIS — F900 Attention-deficit hyperactivity disorder, predominantly inattentive type: Secondary | ICD-10-CM

## 2020-06-12 ENCOUNTER — Ambulatory Visit: Payer: Managed Care, Other (non HMO) | Admitting: Podiatry

## 2020-06-14 ENCOUNTER — Other Ambulatory Visit: Payer: Self-pay | Admitting: Psychiatry

## 2020-06-14 DIAGNOSIS — F900 Attention-deficit hyperactivity disorder, predominantly inattentive type: Secondary | ICD-10-CM

## 2020-06-14 NOTE — Telephone Encounter (Signed)
Next apt 01/20

## 2020-07-13 ENCOUNTER — Other Ambulatory Visit: Payer: Self-pay | Admitting: Psychiatry

## 2020-07-13 DIAGNOSIS — F900 Attention-deficit hyperactivity disorder, predominantly inattentive type: Secondary | ICD-10-CM

## 2020-07-27 ENCOUNTER — Ambulatory Visit (INDEPENDENT_AMBULATORY_CARE_PROVIDER_SITE_OTHER): Payer: 59 | Admitting: Psychiatry

## 2020-07-27 ENCOUNTER — Other Ambulatory Visit: Payer: Self-pay

## 2020-07-27 ENCOUNTER — Encounter: Payer: Self-pay | Admitting: Psychiatry

## 2020-07-27 DIAGNOSIS — F3341 Major depressive disorder, recurrent, in partial remission: Secondary | ICD-10-CM | POA: Diagnosis not present

## 2020-07-27 DIAGNOSIS — F900 Attention-deficit hyperactivity disorder, predominantly inattentive type: Secondary | ICD-10-CM | POA: Diagnosis not present

## 2020-07-27 MED ORDER — SERTRALINE HCL 50 MG PO TABS: 50.0000 mg | ORAL_TABLET | Freq: Every day | ORAL | 1 refills | Status: DC

## 2020-07-27 NOTE — Progress Notes (Signed)
Taylor Oconnell [id number] 09-Oct-1955 65 y.o.  Subjective:   Patient ID:  Taylor Oconnell is a 65 y.o. (DOB 01-26-1956) female.  Chief Complaint:  Chief Complaint  Patient presents with  . Follow-up  . Anxiety  . ADHD    Depression        Associated symptoms include decreased concentration.  Associated symptoms include no suicidal ideas.     Taylor Oconnell presents to the office today for follow-up of ADD and depression.  Last seen Jan 2021. No med changes. On the following meds: Continue meds without changing. Adderall XR 75 daily Wellbutrin XL 300 AM Sertraline 25 daily  01/26/20 appt with the following noted: Retired 4 years.  Stays occupied.   Still doing well.  It helps that I'm a home-body re: Covid. Still handles isolation fine.  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   "I think I've been fine"  But family complains vaguely about things like she's slow.  Do get side-tracked and is chronically slow too.  Taylor Oconnell says she's different perhaps less attentive.  Patient doesn't notice much change.  She doesn't have a schedule and that contributes to lower productivity.  Compliant with meds.  Satisfied with meds.  Plan: Continue meds without changing. Adderall XR 75 daily Wellbutrin XL 300 AM Sertraline 25 daily Wellbutrin XL 300 AM  07/27/2020 appointment with the following noted:  More stress that Taylor Oconnell's BF cheated and she moved back in.  She and Taylor Oconnell butt head leading to more stress.  Pt increased sertraline in the last 6 weeks DT this stress.  Then pt cried more and this is better controlled on 50 mg sertraline. She'll build a house behind them and so will be there for a year.    Patient reports stable mood and denies depressed or irritable moods.  Patient denies difficulty with sleep initiation or maintenance. Denies appetite disturbance.  Patient reports that energy and motivation have been good.  Patient denies any  difficulty with concentration.  Patient denies any suicidal ideation.  Taylor Oconnell's health is stable.  She still feels he needs to be on meds.  Get's irritable and angry to easily.   Of note last med change was added sertraline 25 mg in February 2019 for excessive tearfulness.  It helped until recently as noted.  When Taylor Oconnell is hateful can cry.    She's retired for 2017. Loves it..  Tendency to white coat syndrome.  PE and increased BP med and she has readings that are within the normal range.  Past Psychiatric Medication Trials: Wellbutrin XL 300, Adderall XR 75 daily, sertraline 50 .   Review of Systems:  Review of Systems  Cardiovascular: Negative for palpitations.  Neurological: Negative for tremors and weakness.  Psychiatric/Behavioral: Positive for decreased concentration and depression. Negative for agitation, behavioral problems, confusion, dysphoric mood, hallucinations, self-injury, sleep disturbance and suicidal ideas. The patient is not nervous/anxious and is not hyperactive.   Depression under control.  Medications: I have reviewed the patient's current medications.  Current Outpatient Medications  Medication Sig Dispense Refill  . amphetamine-dextroamphetamine (ADDERALL XR) 25 MG 24 hr capsule TAKE 2 CAPSULES BY MOUTH EVERY DAY IN THE MORNING AND TAKE ONE CAPSULE AT NOON 90 capsule 0  . Ascorbic Acid (VITAMIN C CR) 1000 MG TBCR Take by mouth.    Marland Kitchen buPROPion (WELLBUTRIN XL) 300 MG 24 hr tablet Take 1 tablet (300 mg total) by mouth every morning. 90 tablet 3  .  Cetirizine HCl (ZYRTEC ALLERGY PO) Take by mouth.    . hydrochlorothiazide (HYDRODIURIL) 25 MG tablet     . oxybutynin (DITROPAN-XL) 5 MG 24 hr tablet Take 5 mg by mouth daily.    . ramipril (ALTACE) 10 MG capsule Take 10 mg by mouth daily.    . sertraline (ZOLOFT) 50 MG tablet Take 1 tablet (50 mg total) by mouth daily. 90 tablet 1   No current facility-administered medications for this visit.    Medication Side  Effects: None  Allergies:  Allergies  Allergen Reactions  . Sulfamethoxazole Swelling    History reviewed. No pertinent past medical history.  Family History  Problem Relation Age of Onset  . Bipolar disorder Paternal Grandmother     Social History   Socioeconomic History  . Marital status: Married    Spouse name: Not on file  . Number of children: Not on file  . Years of education: Not on file  . Highest education level: Not on file  Occupational History  . Not on file  Tobacco Use  . Smoking status: Never Smoker  . Smokeless tobacco: Never Used  Substance and Sexual Activity  . Alcohol use: Not Currently  . Drug use: Never  . Sexual activity: Yes    Partners: Male    Birth control/protection: None  Other Topics Concern  . Not on file  Social History Narrative  . Not on file   Social Determinants of Health   Financial Resource Strain: Not on file  Food Insecurity: Not on file  Transportation Needs: Not on file  Physical Activity: Not on file  Stress: Not on file  Social Connections: Not on file  Intimate Partner Violence: Not on file    Past Medical History, Surgical history, Social history, and Family history were reviewed and updated as appropriate.   Please see review of systems for further details on the patient's review from today.   Objective:   Physical Exam:  There were no vitals taken for this visit.  Physical Exam Constitutional:      General: She is not in acute distress.    Appearance: She is well-developed.  Musculoskeletal:        General: No deformity.  Neurological:     Mental Status: She is alert and oriented to person, place, and time.     Motor: No tremor.     Coordination: Coordination normal.     Gait: Gait normal.  Psychiatric:        Attention and Perception: Attention and perception normal.        Mood and Affect: Mood is depressed. Mood is not anxious. Affect is not labile, blunt, angry or inappropriate.        Speech:  Speech normal.        Behavior: Behavior normal.        Thought Content: Thought content normal. Thought content does not include homicidal or suicidal ideation. Thought content does not include homicidal or suicidal plan.        Cognition and Memory: Cognition normal.        Judgment: Judgment normal.     Comments: Insight intact. No auditory or visual hallucinations. No delusions.  More stressed     Lab Review:  No results found for: NA, K, CL, CO2, GLUCOSE, BUN, CREATININE, CALCIUM, PROT, ALBUMIN, AST, ALT, ALKPHOS, BILITOT, GFRNONAA, GFRAA  No results found for: WBC, RBC, HGB, HCT, PLT, MCV, MCH, MCHC, RDW, LYMPHSABS, MONOABS, EOSABS, BASOSABS  No results found for:  POCLITH, LITHIUM   No results found for: PHENYTOIN, PHENOBARB, VALPROATE, CBMZ   .res Assessment: Plan:    Taylor Oconnell was seen today for follow-up, anxiety and adhd.  Diagnoses and all orders for this visit:  Recurrent major depression in partial remission ([place]) -     sertraline (ZOLOFT) 50 MG tablet; Take 1 tablet (50 mg total) by mouth daily.  Attention deficit hyperactivity disorder (ADHD), predominantly inattentive type   Chronic ADD only marginally controlled but on max meds.  And is retired now so less demands.  BP controlled.   Discussed potential benefits, risks, and side effects of stimulants with patient to include increased heart rate, palpitations, insomnia, increased anxiety, increased irritability, or decreased appetite.  Instructed patient to contact office if experiencing any significant tolerability issues. Reviewed recent readings with her and OK.  hearing aids for mental health reasons.  It's helped.  Excessive tearfulness helped more  with increase to 50 mg sertraline.  Continue meds without changing. Adderall XR 75 daily Wellbutrin XL 300 AM Sertraline 50 daily  Option Adzenys for expense if needed.  FU 6 mo or sooner  Lynder Parents, MD, DFAPA  Please see After Visit Summary for  patient specific instructions.  No future appointments.  No orders of the defined types were placed in this encounter.     -------------------------------

## 2020-08-11 ENCOUNTER — Other Ambulatory Visit: Payer: Self-pay | Admitting: Psychiatry

## 2020-08-11 DIAGNOSIS — F900 Attention-deficit hyperactivity disorder, predominantly inattentive type: Secondary | ICD-10-CM

## 2020-09-12 ENCOUNTER — Other Ambulatory Visit (HOSPITAL_COMMUNITY): Payer: Self-pay | Admitting: Obstetrics and Gynecology

## 2020-09-12 DIAGNOSIS — R011 Cardiac murmur, unspecified: Secondary | ICD-10-CM

## 2020-09-15 ENCOUNTER — Telehealth: Payer: Self-pay

## 2020-09-15 NOTE — Telephone Encounter (Signed)
Prior Authorization submitted for AMPHETAMINE-DEXTROAMPHETAMINE XR 25 MG 3/DAY, [employer] responded with approved but ONLY for a daily limit of 60 mg per day. Effective 08/12/2020-09/11/2021. Her current dose is 75 mg/day. No reason is given on response for limit. Will update Dr. Clovis Pu on how to proceed.   [employer]/FEP  [phone] p [phone] f.

## 2020-09-16 NOTE — Telephone Encounter (Signed)
This is not a surprise.  She is retired I think.  Let her know.

## 2020-09-18 NOTE — Telephone Encounter (Signed)
Left her message to call back to discuss

## 2020-09-21 NOTE — Telephone Encounter (Signed)
The only economical way around this problem is to continue the Adderall XR 25 mg 2 a day and then add Adzenys 15.7 mg which is an equivalent dose.  She will need to go through either [facility] locally or the mail order form pharmacy to get a reasonable price.

## 2020-09-21 NOTE — Telephone Encounter (Signed)
Pt called back and I relayed msg, we discussed options. She reports you mentioned something else for her to try if hers became too expensive? She can not remember what you said though.  She uses [facility].

## 2020-09-25 ENCOUNTER — Other Ambulatory Visit: Payer: Self-pay | Admitting: Psychiatry

## 2020-09-25 NOTE — Telephone Encounter (Signed)
The only economical way around this problem is to continue the Adderall XR 25 mg 2 a day and then add Adzenys 15.7 mg which is an equivalent dose.  She will need to go through either [facility] locally or the mail order form pharmacy to get a reasonable price.  So practically speaking, she would take Adderall XR 25 mg twice daily and add Adzenys 15.7 mg in the morning with the morning Adderall.  But to keep get the cheapest price on yet she would need to try it through [facility].  Her pharmacy [facility] will charge significantly more.

## 2020-09-26 NOTE — Telephone Encounter (Signed)
Left message to call back  

## 2020-10-04 DIAGNOSIS — D242 Benign neoplasm of left breast: Secondary | ICD-10-CM | POA: Insufficient documentation

## 2020-10-06 ENCOUNTER — Other Ambulatory Visit: Payer: Self-pay

## 2020-10-06 ENCOUNTER — Ambulatory Visit (HOSPITAL_COMMUNITY)
Admission: RE | Admit: 2020-10-06 | Discharge: 2020-10-06 | Disposition: A | Discharge status: Home / Self Care | Payer: Medicare Other | Source: Ambulatory Visit | Attending: Obstetrics and Gynecology | Admitting: Obstetrics and Gynecology

## 2020-10-06 DIAGNOSIS — R0600 Dyspnea, unspecified: Secondary | ICD-10-CM | POA: Insufficient documentation

## 2020-10-06 DIAGNOSIS — R011 Cardiac murmur, unspecified: Secondary | ICD-10-CM

## 2020-10-06 LAB — ECHOCARDIOGRAM COMPLETE
Area-P 1/2: 4.68 cm2
Calc EF: 67.1 %
S' Lateral: 2.9 cm
Single Plane A2C EF: 66.2 %
Single Plane A4C EF: 64.6 %

## 2020-10-06 NOTE — Progress Notes (Signed)
  Echocardiogram 2D Echocardiogram has been performed.  Taylor Oconnell 10/06/2020, 3:07 PM

## 2020-10-09 ENCOUNTER — Telehealth: Payer: Self-pay | Admitting: Psychiatry

## 2020-10-09 NOTE — Telephone Encounter (Signed)
It's from a prior authorization a few weeks ago. I will contact her tomorrow.

## 2020-10-09 NOTE — Telephone Encounter (Signed)
Did you try to call this pt?

## 2020-10-09 NOTE — Telephone Encounter (Signed)
Taylor Oconnell is returning your phone call. Please call her back at [phone]

## 2020-10-10 ENCOUNTER — Other Ambulatory Visit: Payer: Self-pay

## 2020-10-10 ENCOUNTER — Other Ambulatory Visit: Payer: Self-pay | Admitting: Psychiatry

## 2020-10-10 DIAGNOSIS — F900 Attention-deficit hyperactivity disorder, predominantly inattentive type: Secondary | ICD-10-CM

## 2020-10-10 MED ORDER — ADZENYS XR-ODT 15.7 MG PO TBED: 1.0000 | EXTENDED_RELEASE_TABLET | Freq: Every day | ORAL | 0 refills | Status: DC

## 2020-10-10 MED ORDER — AMPHETAMINE-DEXTROAMPHET ER 25 MG PO CP24: ORAL_CAPSULE | ORAL | 0 refills | Status: DC

## 2020-10-10 NOTE — Telephone Encounter (Signed)
sent 

## 2020-10-10 NOTE — Telephone Encounter (Signed)
Finally was able to talk to pt about Adderall. She did agree to try the 2 Adderall XR 25 mg daily then add the Adzenys to that. She would like to try [facility] for cost. Advised her to call back with any issues.   I will pend the Adderall XR 25 mg for [facility], then if you can send the Adzenys to [facility]

## 2020-10-17 NOTE — Telephone Encounter (Signed)
This has been taken care of.

## 2021-01-24 ENCOUNTER — Ambulatory Visit: Payer: 59 | Admitting: Psychiatry

## 2021-02-07 ENCOUNTER — Other Ambulatory Visit: Payer: Self-pay

## 2021-02-07 ENCOUNTER — Encounter: Payer: Self-pay | Admitting: Psychiatry

## 2021-02-07 ENCOUNTER — Ambulatory Visit (INDEPENDENT_AMBULATORY_CARE_PROVIDER_SITE_OTHER): Payer: Medicare Other | Admitting: Psychiatry

## 2021-02-07 VITALS — BP 157/86 | HR 72

## 2021-02-07 DIAGNOSIS — F3342 Major depressive disorder, recurrent, in full remission: Secondary | ICD-10-CM | POA: Diagnosis not present

## 2021-02-07 DIAGNOSIS — F900 Attention-deficit hyperactivity disorder, predominantly inattentive type: Secondary | ICD-10-CM | POA: Diagnosis not present

## 2021-02-07 DIAGNOSIS — F3341 Major depressive disorder, recurrent, in partial remission: Secondary | ICD-10-CM

## 2021-02-07 MED ORDER — AMPHETAMINE-DEXTROAMPHET ER 25 MG PO CP24: 25.0000 mg | ORAL_CAPSULE | Freq: Two times a day (BID) | ORAL | 0 refills | Status: DC

## 2021-02-07 MED ORDER — ADZENYS XR-ODT 15.7 MG PO TBED: 1.0000 | EXTENDED_RELEASE_TABLET | Freq: Every day | ORAL | 0 refills | Status: DC

## 2021-02-07 MED ORDER — SERTRALINE HCL 50 MG PO TABS: 50.0000 mg | ORAL_TABLET | Freq: Every day | ORAL | 1 refills | Status: DC

## 2021-02-07 MED ORDER — AMPHETAMINE-DEXTROAMPHET ER 25 MG PO CP24: ORAL_CAPSULE | ORAL | 0 refills | Status: DC

## 2021-02-07 MED ORDER — BUPROPION HCL ER (XL) 300 MG PO TB24: 300.0000 mg | ORAL_TABLET | ORAL | 3 refills | Status: DC

## 2021-02-07 NOTE — Progress Notes (Signed)
Taylor Oconnell [id number] 10-08-1955 65 y.o.  Subjective:   Patient ID:  Taylor Oconnell is a 65 y.o. (DOB 23-Jan-1956) female.  Chief Complaint:  Chief Complaint  Patient presents with   Follow-up   Depression   ADHD    Depression        Associated symptoms include decreased concentration.  Associated symptoms include no suicidal ideas.    Taylor Oconnell presents to the office today for follow-up of ADD and depression.  seen Jan 2021. No med changes. On the following meds: Continue meds without changing. Adderall XR 75 daily Wellbutrin XL 300 AM Sertraline 25 daily  01/26/20 appt with the following noted: Retired 4 years.  Stays occupied.   Still doing well.  It helps that I'm a home-body re: Covid. Still handles isolation fine.  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   "I think I've been fine"  But family complains vaguely about things like she's slow.  Do get side-tracked and is chronically slow too.  [name] says she's different perhaps less attentive.  Patient doesn't notice much change.  She doesn't have a schedule and that contributes to lower productivity.  Compliant with meds.  Satisfied with meds.  Plan: Continue meds without changing. Adderall XR 75 daily Wellbutrin XL 300 AM Sertraline 25 daily Wellbutrin XL 300 AM  07/27/2020 appointment with the following noted:  More stress that [name]'s BF cheated and she moved back in.  She and [name] butt head leading to more stress.  Pt increased sertraline in the last 6 weeks DT this stress.  Then pt cried more and this is better controlled on 50 mg sertraline. She'll build a house behind them and so will be there for a year.   Plan: Continue meds without changing. Adderall XR 75 daily Wellbutrin XL 300 AM Sertraline 50 daily  As of  09/15/20 insurance no longer would cover any higher dose than Adderall XR 25 mg 2 daily.  It was suggested that she substitute one of the Adderall  XR with Adzenys 15.7 mg.  02/07/2021 appointment with the following noted: Hasn't tried Adzenys yet.  Several mos of health problems and couple of surgeries and Mo in law died in 12-21-2022 and never made it to [employer].  Does want to try it.  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  Normal grief. Health under control. Bounces from one thing to another.  Which is better with higher dose Adderall.  Forgetful and distractible.  Patient reports stable mood and denies depressed or irritable moods.  Patient denies difficulty with sleep initiation or maintenance. Denies appetite disturbance.  Patient reports that energy and motivation have been good.  Patient denies any difficulty with concentration.  Patient denies any suicidal ideation.  [name]'s health is stable.  She still feels he needs to be on meds.  Get's irritable and angry to easily.   Of note last med change was added sertraline 25 mg in February 2019 for excessive tearfulness.  It helped until recently as noted.  When [name] is hateful can cry.    She's retired for 2017. Loves it..  Tendency to white coat syndrome.  PE and increased BP med and she has readings that are within the normal range.  Past Psychiatric Medication Trials: Wellbutrin XL 300, Adderall XR 75 daily, sertraline 50 .   Review of Systems:  Review of Systems  Cardiovascular:  Negative for chest pain and palpitations.  Neurological:  Negative for  tremors and weakness.  Psychiatric/Behavioral:  Positive for decreased concentration. Negative for agitation, behavioral problems, confusion, dysphoric mood, hallucinations, self-injury, sleep disturbance and suicidal ideas. The patient is not nervous/anxious and is not hyperactive.  Depression under control.  Medications: I have reviewed the patient's current medications.  Current Outpatient Medications  Medication Sig Dispense Refill   [START ON 03/07/2021] amphetamine-dextroamphetamine (ADDERALL XR) 25 MG 24 hr capsule Take 1 capsule by mouth 2  (two) times daily. 60 capsule 0   amphetamine-dextroamphetamine (ADDERALL XR) 25 MG 24 hr capsule Take 1 capsule by mouth 2 (two) times daily. 60 capsule 0   Ascorbic Acid (VITAMIN C CR) 1000 MG TBCR Take by mouth.     Cetirizine HCl (ZYRTEC ALLERGY PO) Take by mouth.     hydrochlorothiazide (HYDRODIURIL) 25 MG tablet      oxybutynin (DITROPAN-XL) 5 MG 24 hr tablet Take 5 mg by mouth daily.     ramipril (ALTACE) 10 MG capsule Take 10 mg by mouth daily.     Amphetamine ER (ADZENYS XR-ODT) 15.7 MG TBED Take 1 tablet by mouth daily at 12 noon. 30 tablet 0   [START ON 04/04/2021] amphetamine-dextroamphetamine (ADDERALL XR) 25 MG 24 hr capsule TAKE 2 CAPSULES BY MOUTH EVERY DAY IN THE MORNING 60 capsule 0   buPROPion (WELLBUTRIN XL) 300 MG 24 hr tablet Take 1 tablet (300 mg total) by mouth every morning. 90 tablet 3   sertraline (ZOLOFT) 50 MG tablet Take 1 tablet (50 mg total) by mouth daily. 90 tablet 1   No current facility-administered medications for this visit.    Medication Side Effects: None  Allergies:  Allergies  Allergen Reactions   Sulfamethoxazole Swelling    History reviewed. No pertinent past medical history.  Family History  Problem Relation Age of Onset   Bipolar disorder Paternal Grandmother     Social History   Socioeconomic History   Marital status: Married    Spouse name: Not on file   Number of children: Not on file   Years of education: Not on file   Highest education level: Not on file  Occupational History   Not on file  Tobacco Use   Smoking status: Never   Smokeless tobacco: Never  Substance and Sexual Activity   Alcohol use: Not Currently   Drug use: Never   Sexual activity: Yes    Partners: Male    Birth control/protection: None  Other Topics Concern   Not on file  Social History Narrative   Not on file   Social Determinants of Health   Financial Resource Strain: Not on file  Food Insecurity: Not on file  Transportation Needs: Not on  file  Physical Activity: Not on file  Stress: Not on file  Social Connections: Not on file  Intimate Partner Violence: Not on file    Past Medical History, Surgical history, Social history, and Family history were reviewed and updated as appropriate.   Please see review of systems for further details on the patient's review from today.   Objective:   Physical Exam:  BP (!) 157/86   Pulse 72   Physical Exam Constitutional:      General: She is not in acute distress.    Appearance: She is well-developed.  Musculoskeletal:        General: No deformity.  Neurological:     Mental Status: She is alert and oriented to person, place, and time.     Motor: No tremor.     Coordination: Coordination  normal.     Gait: Gait normal.  Psychiatric:        Attention and Perception: Attention and perception normal.        Mood and Affect: Mood is not anxious or depressed. Affect is not labile, blunt, angry or inappropriate.        Speech: Speech normal.        Behavior: Behavior normal.        Thought Content: Thought content normal. Thought content does not include homicidal or suicidal ideation. Thought content does not include homicidal or suicidal plan.        Cognition and Memory: Cognition normal.        Judgment: Judgment normal.     Comments: Insight intact. No auditory or visual hallucinations. No delusions.      Lab Review:  No results found for: NA, K, CL, CO2, GLUCOSE, BUN, CREATININE, CALCIUM, PROT, ALBUMIN, AST, ALT, ALKPHOS, BILITOT, GFRNONAA, GFRAA  No results found for: WBC, RBC, HGB, HCT, PLT, MCV, MCH, MCHC, RDW, LYMPHSABS, MONOABS, EOSABS, BASOSABS  No results found for: POCLITH, LITHIUM   No results found for: PHENYTOIN, PHENOBARB, VALPROATE, CBMZ   .res Assessment: Plan:    Tarissa was seen today for follow-up, depression and adhd.  Diagnoses and all orders for this visit:  Recurrent major depression in partial remission ([place]) -     sertraline (ZOLOFT) 50 MG  tablet; Take 1 tablet (50 mg total) by mouth daily.  Attention deficit hyperactivity disorder (ADHD), predominantly inattentive type -     amphetamine-dextroamphetamine (ADDERALL XR) 25 MG 24 hr capsule; TAKE 2 CAPSULES BY MOUTH EVERY DAY IN THE MORNING -     amphetamine-dextroamphetamine (ADDERALL XR) 25 MG 24 hr capsule; Take 1 capsule by mouth 2 (two) times daily. -     amphetamine-dextroamphetamine (ADDERALL XR) 25 MG 24 hr capsule; Take 1 capsule by mouth 2 (two) times daily. -     Amphetamine ER (ADZENYS XR-ODT) 15.7 MG TBED; Take 1 tablet by mouth daily at 12 noon.  Recurrent major depression in full remission ([facility]) -     buPROPion (WELLBUTRIN XL) 300 MG 24 hr tablet; Take 1 tablet (300 mg total) by mouth every morning.  Chronic ADD only marginally controlled but on max meds.  And is retired now so less demands.  BP controlled.   Discussed potential benefits, risks, and side effects of stimulants with patient to include increased heart rate, palpitations, insomnia, increased anxiety, increased irritability, or decreased appetite.  Instructed patient to contact office if experiencing any significant tolerability issues. Reviewed recent readings with her and OK.  hearing aids for mental health reasons.  It's helped.  Excessive tearfulness helped more  with increase to 50 mg sertraline.  Continue meds without changing. Adderall XR 50 daily plus Adzenys 15.7 daily Wellbutrin XL 300 AM Sertraline 50 daily  Option Adzenys for expense if needed.  FU 6 mo or sooner  Lynder Parents, MD, DFAPA  Please see After Visit Summary for patient specific instructions.  No future appointments.  No orders of the defined types were placed in this encounter.     -------------------------------

## 2021-02-19 ENCOUNTER — Ambulatory Visit (INDEPENDENT_AMBULATORY_CARE_PROVIDER_SITE_OTHER): Payer: Medicare Other | Admitting: Podiatry

## 2021-02-19 ENCOUNTER — Other Ambulatory Visit: Payer: Self-pay

## 2021-02-19 DIAGNOSIS — M79676 Pain in unspecified toe(s): Secondary | ICD-10-CM | POA: Diagnosis not present

## 2021-02-19 DIAGNOSIS — L603 Nail dystrophy: Secondary | ICD-10-CM | POA: Diagnosis not present

## 2021-02-19 NOTE — Progress Notes (Signed)
  Subjective:  Patient ID: LORETTE SOTH, female    DOB: [birth year],  MRN: [id number]  Chief Complaint  Patient presents with   Nail Problem    F/U Lt hallux nail -pt states," it doesn't gow much, I can't pain it with nail polish. It doesn't want to grow out any further." Tx: none    65 y.o. female presents with the above complaint. History confirmed with patient  Objective:  Physical Exam: warm, good capillary refill, no trophic changes or ulcerative lesions, normal DP and PT pulses and normal sensory exam. Left Foot: left hallux nail growing clear and smooth but slightly thickened.  Assessment:   1. Nail dystrophy   2. Pain around toenail    Plan:  Patient was evaluated and treated and all questions answered.  Nail Dystrophy -Improved c/t prior. Debrided today including reducing thickness, courtesy. F/u should issues persist.  No follow-ups on file.

## 2021-02-22 ENCOUNTER — Telehealth: Payer: Self-pay | Admitting: Psychiatry

## 2021-02-22 NOTE — Telephone Encounter (Signed)
Pt called and said that a new medicine was added and she needs clarification on how to take it with her adderall. Please give her a call at [phone]

## 2021-02-22 NOTE — Telephone Encounter (Signed)
Spoke to pt and clarified how to take the adenzys with adderall

## 2021-05-24 ENCOUNTER — Other Ambulatory Visit: Payer: Self-pay | Admitting: Psychiatry

## 2021-05-24 DIAGNOSIS — F900 Attention-deficit hyperactivity disorder, predominantly inattentive type: Secondary | ICD-10-CM

## 2021-05-25 ENCOUNTER — Other Ambulatory Visit: Payer: Self-pay | Admitting: Psychiatry

## 2021-05-25 NOTE — Telephone Encounter (Signed)
Please send refill 

## 2021-05-29 ENCOUNTER — Other Ambulatory Visit: Payer: Self-pay

## 2021-05-29 DIAGNOSIS — F900 Attention-deficit hyperactivity disorder, predominantly inattentive type: Secondary | ICD-10-CM

## 2021-05-29 MED ORDER — AMPHETAMINE-DEXTROAMPHET ER 25 MG PO CP24: ORAL_CAPSULE | ORAL | 0 refills | Status: DC

## 2021-08-09 ENCOUNTER — Other Ambulatory Visit: Payer: Self-pay

## 2021-08-09 ENCOUNTER — Ambulatory Visit (INDEPENDENT_AMBULATORY_CARE_PROVIDER_SITE_OTHER): Payer: Medicare Other | Admitting: Psychiatry

## 2021-08-09 ENCOUNTER — Encounter: Payer: Self-pay | Admitting: Psychiatry

## 2021-08-09 VITALS — BP 161/92 | HR 77

## 2021-08-09 DIAGNOSIS — F3342 Major depressive disorder, recurrent, in full remission: Secondary | ICD-10-CM

## 2021-08-09 DIAGNOSIS — F3341 Major depressive disorder, recurrent, in partial remission: Secondary | ICD-10-CM

## 2021-08-09 DIAGNOSIS — F900 Attention-deficit hyperactivity disorder, predominantly inattentive type: Secondary | ICD-10-CM | POA: Diagnosis not present

## 2021-08-09 MED ORDER — ADZENYS XR-ODT 15.7 MG PO TBED: 1.0000 | EXTENDED_RELEASE_TABLET | ORAL | 0 refills | Status: AC

## 2021-08-09 MED ORDER — BUPROPION HCL ER (XL) 300 MG PO TB24: 300.0000 mg | ORAL_TABLET | ORAL | 3 refills | Status: DC

## 2021-08-09 MED ORDER — SERTRALINE HCL 50 MG PO TABS: 50.0000 mg | ORAL_TABLET | Freq: Every day | ORAL | 1 refills | Status: DC

## 2021-08-09 NOTE — Progress Notes (Signed)
Taylor Oconnell [id number] 29-Oct-1955 66 y.o.  Subjective:   Patient ID:  Taylor Oconnell is a 66 y.o. (DOB July 05, 1956) female.  Chief Complaint:  Chief Complaint  Patient presents with   Follow-up   Depression   ADHD   Anxiety    Depression        Associated symptoms include decreased concentration.  Associated symptoms include no suicidal ideas.    [name] presents to the office today for follow-up of ADD and depression.  seen Jan 2021. No med changes. On the following meds: Continue meds without changing. Adderall XR 75 daily Wellbutrin XL 300 AM Sertraline 25 daily  01/26/20 appt with the following noted: Retired 4 years.  Stays occupied.   Still doing well.  It helps that I'm a home-body re: Covid. Still handles isolation fine.  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   "I think I've been fine"  But family complains vaguely about things like she's slow.  Do get side-tracked and is chronically slow too.  [name] says she's different perhaps less attentive.  Patient doesn't notice much change.  She doesn't have a schedule and that contributes to lower productivity.  Compliant with meds.  Satisfied with meds.  Plan: Continue meds without changing. Adderall XR 75 daily Wellbutrin XL 300 AM Sertraline 25 daily Wellbutrin XL 300 AM  07/27/2020 appointment with the following noted:  More stress that [name]'s BF cheated and she moved back in.  She and [name] butt head leading to more stress.  Pt increased sertraline in the last 6 weeks DT this stress.  Then pt cried more and this is better controlled on 50 mg sertraline. She'll build a house behind them and so will be there for a year.   Plan: Continue meds without changing. Adderall XR 75 daily Wellbutrin XL 300 AM Sertraline 50 daily  As of  09/15/20 insurance no longer would cover any higher dose than Adderall XR 25 mg 2 daily.  It was suggested that she substitute one of the  Adderall XR with Adzenys 15.7 mg.  02/07/2021 appointment with the following noted: Hasn't tried Adzenys yet.  Several mos of health problems and couple of surgeries and Mo in law died in 2022/11/28 and never made it to [employer].  Does want to try it.  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  Normal grief. Health under control. Bounces from one thing to another.  Which is better with higher dose Adderall.  Forgetful and distractible. Of note last med change was added sertraline 25 mg in February 2019 for excessive tearfulness.  It helped until recently as noted.  When [name] is hateful can cry.   Plan: Continue meds without changing. Adderall XR 50 daily plus Adzenys 15.7 daily Wellbutrin XL 300 AM Sertraline 50 daily  08/09/2021 appointment with the following noted: Stopped Adderall but still takes Adzenys prn.  Didn't seem like the Adderall made much difference but did have sleepiness for awhile which has worn off. Chronically sccattered but Adzenys works better than adderall Still a night owl. Goes to bed 2 and up at 1000.   No crying spells.  [name] can still hurt her feelings and not as good on Paxil as he was on sertraline. Patient reports stable mood and denies depressed or irritable moods.  Patient denies difficulty with sleep initiation or maintenance. Denies appetite disturbance.  Patient reports that energy and motivation have been good.  Patient denies any difficulty with  concentration.  Patient denies any suicidal ideation.  [name]'s health is stable.  She still feels he needs to be on meds.  Get's irritable and angry to easily.     She's retired for 2017. Loves it..  Tendency to white coat syndrome.  PE and increased BP med and she has readings that are within the normal range.  Past Psychiatric Medication Trials: Wellbutrin XL 300, Adderall XR 75 daily, sertraline 50 .   Review of Systems:  Review of Systems  Cardiovascular:  Negative for palpitations.  Neurological:  Negative for tremors and  weakness.  Psychiatric/Behavioral:  Positive for decreased concentration. Negative for agitation, behavioral problems, confusion, dysphoric mood, hallucinations, self-injury, sleep disturbance and suicidal ideas. The patient is not nervous/anxious and is not hyperactive.  Depression under control.  Medications: I have reviewed the patient's current medications.  Current Outpatient Medications  Medication Sig Dispense Refill   ADZENYS XR-ODT 15.7 MG TBED TAKE ONE TABLET BY MOUTH DAILY AT 12PM 30 tablet 0   Amphetamine ER (ADZENYS XR-ODT) 15.7 MG TBED Take 1 tablet by mouth every morning. 30 tablet 0   [START ON 09/08/2021] Amphetamine ER (ADZENYS XR-ODT) 15.7 MG TBED Take 1 tablet by mouth every morning. 30 tablet 0   [START ON 10/08/2021] Amphetamine ER (ADZENYS XR-ODT) 15.7 MG TBED Take 1 tablet by mouth every morning. 30 tablet 0   Ascorbic Acid (VITAMIN C CR) 1000 MG TBCR Take by mouth.     Cetirizine HCl (ZYRTEC ALLERGY PO) Take by mouth.     Cetirizine HCl 10 MG CAPS Take 1 tablet by mouth every evening.     hydrochlorothiazide (HYDRODIURIL) 25 MG tablet      oxybutynin (DITROPAN-XL) 10 MG 24 hr tablet Take 10 mg by mouth daily.     oxybutynin (DITROPAN-XL) 5 MG 24 hr tablet Take 5 mg by mouth daily.     Oxymetazoline HCl (RHOFADE) 1 % CREA Apply topically.     ramipril (ALTACE) 10 MG capsule Take 10 mg by mouth daily.     buPROPion (WELLBUTRIN XL) 300 MG 24 hr tablet Take 1 tablet (300 mg total) by mouth every morning. 90 tablet 3   sertraline (ZOLOFT) 50 MG tablet Take 1 tablet (50 mg total) by mouth daily. 90 tablet 1   No current facility-administered medications for this visit.    Medication Side Effects: None  Allergies:  Allergies  Allergen Reactions   Sulfamethoxazole Swelling    History reviewed. No pertinent past medical history.  Family History  Problem Relation Age of Onset   Bipolar disorder Paternal Grandmother     Social History   Socioeconomic History    Marital status: Married    Spouse name: Not on file   Number of children: Not on file   Years of education: Not on file   Highest education level: Not on file  Occupational History   Not on file  Tobacco Use   Smoking status: Never   Smokeless tobacco: Never  Substance and Sexual Activity   Alcohol use: Not Currently   Drug use: Never   Sexual activity: Yes    Partners: Male    Birth control/protection: None  Other Topics Concern   Not on file  Social History Narrative   Not on file   Social Determinants of Health   Financial Resource Strain: Not on file  Food Insecurity: Not on file  Transportation Needs: Not on file  Physical Activity: Not on file  Stress: Not on file  Social  Connections: Not on file  Intimate Partner Violence: Not on file    Past Medical History, Surgical history, Social history, and Family history were reviewed and updated as appropriate.   Please see review of systems for further details on the patient's review from today.   Objective:   Physical Exam:  BP (!) 161/92    Pulse 77   Physical Exam Constitutional:      General: She is not in acute distress.    Appearance: She is well-developed.  Musculoskeletal:        General: No deformity.  Neurological:     Mental Status: She is alert and oriented to person, place, and time.     Motor: No tremor.     Coordination: Coordination normal.     Gait: Gait normal.  Psychiatric:        Attention and Perception: Attention and perception normal.        Mood and Affect: Mood is not anxious or depressed. Affect is not labile, blunt, angry or inappropriate.        Speech: Speech normal.        Behavior: Behavior normal.        Thought Content: Thought content normal. Thought content is not delusional. Thought content does not include homicidal or suicidal ideation. Thought content does not include suicidal plan.        Cognition and Memory: Cognition normal.        Judgment: Judgment normal.      Comments: Insight intact. No auditory or visual hallucinations. No delusions.      Lab Review:  No results found for: NA, K, CL, CO2, GLUCOSE, BUN, CREATININE, CALCIUM, PROT, ALBUMIN, AST, ALT, ALKPHOS, BILITOT, GFRNONAA, GFRAA  No results found for: WBC, RBC, HGB, HCT, PLT, MCV, MCH, MCHC, RDW, LYMPHSABS, MONOABS, EOSABS, BASOSABS  No results found for: POCLITH, LITHIUM   No results found for: PHENYTOIN, PHENOBARB, VALPROATE, CBMZ   .res Assessment: Plan:    Amica was seen today for follow-up, depression, adhd and anxiety.  Diagnoses and all orders for this visit:  Attention deficit hyperactivity disorder (ADHD), predominantly inattentive type -     Amphetamine ER (ADZENYS XR-ODT) 15.7 MG TBED; Take 1 tablet by mouth every morning. -     Amphetamine ER (ADZENYS XR-ODT) 15.7 MG TBED; Take 1 tablet by mouth every morning. -     Amphetamine ER (ADZENYS XR-ODT) 15.7 MG TBED; Take 1 tablet by mouth every morning.  Recurrent major depression in full remission ([facility]) -     buPROPion (WELLBUTRIN XL) 300 MG 24 hr tablet; Take 1 tablet (300 mg total) by mouth every morning.  Recurrent major depression in partial remission ([facility]) -     sertraline (ZOLOFT) 50 MG tablet; Take 1 tablet (50 mg total) by mouth daily.   Chronic ADD only marginally controlled but on max meds.  And is retired now so less demands.  BP controlled.   Discussed potential benefits, risks, and side effects of stimulants with patient to include increased heart rate, palpitations, insomnia, increased anxiety, increased irritability, or decreased appetite.  Instructed patient to contact office if experiencing any significant tolerability issues. Reviewed recent readings with her and OK.  hearing aids for mental health reasons.  It's helped.  Excessive tearfulness helped more  with increase to 50 mg sertraline.  Continue meds without changing. Stopped Adderall XR 50 daily  Takes prn Adzenys 15.7 daily bc it helps more  than Adderall Wellbutrin XL 300 AM Sertraline 50 daily  FU 6 mo or sooner  Lynder Parents, MD, DFAPA  Please see After Visit Summary for patient specific instructions.  No future appointments.  No orders of the defined types were placed in this encounter.      -------------------------------

## 2022-02-07 ENCOUNTER — Encounter: Payer: Self-pay | Admitting: Psychiatry

## 2022-02-07 ENCOUNTER — Ambulatory Visit (INDEPENDENT_AMBULATORY_CARE_PROVIDER_SITE_OTHER): Payer: Medicare Other | Admitting: Psychiatry

## 2022-02-07 DIAGNOSIS — F900 Attention-deficit hyperactivity disorder, predominantly inattentive type: Secondary | ICD-10-CM | POA: Diagnosis not present

## 2022-02-07 DIAGNOSIS — F3342 Major depressive disorder, recurrent, in full remission: Secondary | ICD-10-CM

## 2022-02-07 DIAGNOSIS — F3341 Major depressive disorder, recurrent, in partial remission: Secondary | ICD-10-CM

## 2022-02-07 MED ORDER — BUPROPION HCL ER (XL) 300 MG PO TB24: 300.0000 mg | ORAL_TABLET | ORAL | 3 refills | Status: DC

## 2022-02-07 MED ORDER — SERTRALINE HCL 100 MG PO TABS: 100.0000 mg | ORAL_TABLET | Freq: Every day | ORAL | 0 refills | Status: DC

## 2022-02-07 NOTE — Progress Notes (Signed)
Taylor Oconnell [id number] 02/02/1956 66 y.o.  Subjective:   Patient ID:  Taylor Oconnell is a 66 y.o. (DOB [birth year]) female.  Chief Complaint:  Chief Complaint  Patient presents with   Follow-up   ADHD   Depression    Depression        Associated symptoms include decreased concentration.  Associated symptoms include no suicidal ideas.     Nevin M Mandich presents to the office today for follow-up of ADD and depression.  seen Jan 2021. No med changes. On the following meds: Continue meds without changing. Adderall XR 75 daily Wellbutrin XL 300 AM Sertraline 25 daily  01/26/20 appt with the following noted: Retired 4 years.  Stays occupied.   Still doing well.  It helps that I'm a home-body re: Covid. Still handles isolation fine.  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   "I think I've been fine"  But family complains vaguely about things like she's slow.  Do get side-tracked and is chronically slow too.  [name] says she's different perhaps less attentive.  Patient doesn't notice much change.  She doesn't have a schedule and that contributes to lower productivity.  Compliant with meds.  Satisfied with meds.  Plan: Continue meds without changing. Adderall XR 75 daily Wellbutrin XL 300 AM Sertraline 25 daily Wellbutrin XL 300 AM  07/27/2020 appointment with the following noted:  More stress that [name]'s BF cheated and she moved back in.  She and Taylor Oconnell butt head leading to more stress.  Pt increased sertraline in the last 6 weeks DT this stress.  Then pt cried more and this is better controlled on 50 mg sertraline. She'll build a house behind them and so will be there for a year.   Plan: Continue meds without changing. Adderall XR 75 daily Wellbutrin XL 300 AM Sertraline 50 daily  As of  09/15/20 insurance no longer would cover any higher dose than Adderall XR 25 mg 2 daily.  It was suggested that she substitute one of the Adderall  XR with Adzenys 15.7 mg.  02/07/2021 appointment with the following noted: Hasn't tried Adzenys yet.  Several mos of health problems and couple of surgeries and Mo in law died in Dec 14, 2022 and never made it to [employer].  Does want to try it.  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  Normal grief. Health under control. Bounces from one thing to another.  Which is better with higher dose Adderall.  Forgetful and distractible. Of note last med change was added sertraline 25 mg in February 2019 for excessive tearfulness.  It helped until recently as noted.  When Taylor Oconnell is hateful can cry.   Plan: Continue meds without changing. Adderall XR 50 daily plus Adzenys 15.7 daily Wellbutrin XL 300 AM Sertraline 50 daily  08/09/2021 appointment with the following noted: Stopped Adderall but still takes Adzenys prn.  Didn't seem like the Adderall made much difference but did have sleepiness for awhile which has worn off. Chronically sccattered but Adzenys works better than adderall Still a night owl. Goes to bed 2 and up at 1000.   No crying spells.  Taylor Oconnell can still hurt her feelings and not as good on Paxil as he was on sertraline. Patient reports stable mood and denies depressed or irritable moods.  Patient denies difficulty with sleep initiation or maintenance. Denies appetite disturbance.  Patient reports that energy and motivation have been good.  Patient denies any difficulty with concentration.  Patient denies any suicidal ideation. Taylor Oconnell's health is stable.  She still feels he needs to be on meds.  Get's irritable and angry to easily.  Plan: Excessive tearfulness helped more  with increase to 50 mg sertraline. Continue meds without changing. Stopped Adderall XR 50 daily  Takes prn Adzenys 15.7 daily bc it helps more than Adderall Wellbutrin XL 300 AM Sertraline 50 daily  02/07/2022 appointment with the following noted: Sometimes takes 2 sertraline over easy crying if Taylor Oconnell is rude to her or critical. Can happen  daily. Goes fishing with Taylor Oconnell and enjoys it.  Not depressed. Anxiety is pretty good but sometimes doesn't want to leave the house bc not interested in seeing people bc they don't have sense any more.  Not sure it's worry but thinks about a lot of things.  General things.Long term homebody. Usually ok to go out to eat with Taylor Oconnell. No SE Sleep always fine. Taylor Oconnell says she's slow and it bothers him.  But she's just not a hurry type person.  Not particularly. No sig checking. Taylor Oconnell better on his meds but not better enough.  She's retired for 2017. Loves it..  Tendency to white coat syndrome.  PE and increased BP med and she has readings that are within the normal range.  Past Psychiatric Medication Trials: Wellbutrin XL 300, Adderall XR 75 daily, sertraline 50 .   Review of Systems:  Review of Systems  Cardiovascular:  Negative for palpitations.  Neurological:  Negative for tremors and weakness.  Psychiatric/Behavioral:  Positive for decreased concentration. Negative for agitation, behavioral problems, confusion, dysphoric mood, hallucinations, self-injury, sleep disturbance and suicidal ideas. The patient is not nervous/anxious and is not hyperactive.   Depression under control.  Medications: I have reviewed the patient's current medications.  Current Outpatient Medications  Medication Sig Dispense Refill   Ascorbic Acid (VITAMIN C CR) 1000 MG TBCR Take by mouth.     Cetirizine HCl (ZYRTEC ALLERGY PO) Take by mouth.     Cetirizine HCl 10 MG CAPS Take 1 tablet by mouth every evening.     hydrochlorothiazide (HYDRODIURIL) 25 MG tablet      Oxymetazoline HCl (RHOFADE) 1 % CREA Apply topically.     ramipril (ALTACE) 10 MG capsule Take 10 mg by mouth daily.     buPROPion (WELLBUTRIN XL) 300 MG 24 hr tablet Take 1 tablet (300 mg total) by mouth every morning. 90 tablet 3   oxybutynin (DITROPAN-XL) 10 MG 24 hr tablet Take 10 mg by mouth daily. (Patient not taking: Reported on 02/07/2022)     oxybutynin  (DITROPAN-XL) 5 MG 24 hr tablet Take 5 mg by mouth daily. (Patient not taking: Reported on 02/07/2022)     sertraline (ZOLOFT) 100 MG tablet Take 1 tablet (100 mg total) by mouth daily. 90 tablet 0   No current facility-administered medications for this visit.    Medication Side Effects: None  Allergies:  Allergies  Allergen Reactions   Sulfamethoxazole Swelling    History reviewed. No pertinent past medical history.  Family History  Problem Relation Age of Onset   Bipolar disorder Paternal Grandmother     Social History   Socioeconomic History   Marital status: Married    Spouse name: Not on file   Number of children: Not on file   Years of education: Not on file   Highest education level: Not on file  Occupational History   Not on file  Tobacco Use   Smoking status: Never   Smokeless tobacco:  Never  Substance and Sexual Activity   Alcohol use: Not Currently   Drug use: Never   Sexual activity: Yes    Partners: Male    Birth control/protection: None  Other Topics Concern   Not on file  Social History Narrative   Not on file   Social Determinants of Health   Financial Resource Strain: Not on file  Food Insecurity: Not on file  Transportation Needs: Not on file  Physical Activity: Not on file  Stress: Not on file  Social Connections: Not on file  Intimate Partner Violence: Not on file    Past Medical History, Surgical history, Social history, and Family history were reviewed and updated as appropriate.   Please see review of systems for further details on the patient's review from today.   Objective:   Physical Exam:  There were no vitals taken for this visit.  Physical Exam Constitutional:      General: She is not in acute distress.    Appearance: She is well-developed.  Musculoskeletal:        General: No deformity.  Neurological:     Mental Status: She is alert and oriented to person, place, and time.     Motor: No tremor.     Coordination:  Coordination normal.     Gait: Gait normal.  Psychiatric:        Attention and Perception: Attention and perception normal.        Mood and Affect: Mood is not anxious or depressed. Affect is not labile, blunt, angry or inappropriate.        Speech: Speech normal.        Behavior: Behavior normal.        Thought Content: Thought content normal. Thought content is not delusional. Thought content does not include homicidal or suicidal ideation. Thought content does not include suicidal plan.        Cognition and Memory: Cognition normal.        Judgment: Judgment normal.     Comments: Insight intact. No auditory or visual hallucinations. No delusions.       Lab Review:  No results found for: "NA", "K", "CL", "CO2", "GLUCOSE", "BUN", "CREATININE", "CALCIUM", "PROT", "ALBUMIN", "AST", "ALT", "ALKPHOS", "BILITOT", "GFRNONAA", "GFRAA"  No results found for: "WBC", "RBC", "HGB", "HCT", "PLT", "MCV", "MCH", "MCHC", "RDW", "LYMPHSABS", "MONOABS", "EOSABS", "BASOSABS"  No results found for: "POCLITH", "LITHIUM"   No results found for: "PHENYTOIN", "PHENOBARB", "VALPROATE", "CBMZ"   .res Assessment: Plan:    Josselin was seen today for follow-up, adhd and depression.  Diagnoses and all orders for this visit:  Recurrent major depression in full remission ([place]) -     buPROPion (WELLBUTRIN XL) 300 MG 24 hr tablet; Take 1 tablet (300 mg total) by mouth every morning.  Attention deficit hyperactivity disorder (ADHD), predominantly inattentive type  Recurrent major depression in partial remission ([facility]) -     sertraline (ZOLOFT) 100 MG tablet; Take 1 tablet (100 mg total) by mouth daily.   Chronic ADD only marginally controlled but on max meds.  And is retired now so less demands.  hearing aids for mental health reasons.  It's helped.  Excessive tearfulness returned so increase to 100 mg sertraline.  Wellbutrin XL 300 AM  FU 12 mos  Lynder Parents, MD, DFAPA  Please see After Visit  Summary for patient specific instructions.  Future Appointments  Date Time Provider [place]  04/03/2022  9:30 AM Kozlow, Donnamarie Poag, MD [facility] None    No  orders of the defined types were placed in this encounter.      -------------------------------

## 2022-04-03 ENCOUNTER — Encounter: Payer: Self-pay | Admitting: Family

## 2022-04-03 ENCOUNTER — Ambulatory Visit (INDEPENDENT_AMBULATORY_CARE_PROVIDER_SITE_OTHER): Payer: Medicare Other | Admitting: Family

## 2022-04-03 VITALS — BP 146/82 | HR 64 | Resp 16 | Ht 62.2 in | Wt 240.8 lb

## 2022-04-03 DIAGNOSIS — J3089 Other allergic rhinitis: Secondary | ICD-10-CM | POA: Diagnosis not present

## 2022-04-03 DIAGNOSIS — L299 Pruritus, unspecified: Secondary | ICD-10-CM

## 2022-04-03 DIAGNOSIS — J310 Chronic rhinitis: Secondary | ICD-10-CM

## 2022-04-03 DIAGNOSIS — K219 Gastro-esophageal reflux disease without esophagitis: Secondary | ICD-10-CM

## 2022-04-03 DIAGNOSIS — J302 Other seasonal allergic rhinitis: Secondary | ICD-10-CM

## 2022-04-03 MED ORDER — IPRATROPIUM BROMIDE 0.03 % NA SOLN: NASAL | 1 refills | Status: DC

## 2022-04-03 NOTE — Progress Notes (Addendum)
NEW PATIENT  Date of Service/Encounter:  04/03/22  Consult requested by: Seward Carol, MD   Assessment:   Seasonal and perennial allergic rhinitis - Plan: Allergy Test  Gustatory rhinitis  Pruritus - Plan: Mitochondrial antibodies, CBC With Differential, Comprehensive metabolic panel, Thyroid Cascade Profile  Gastroesophageal reflux disease, unspecified whether esophagitis present  Plan/Recommendations:    Patient Instructions  Seasonal and perennial allergic rhinitis Skin testing today was positive to a [employer], [place] grass, Johnson grass, grass mix, ragweed mix, weed mix, tree mix, cat, and mite mix with a good histamine response Start avoidance measures as below Continue to alternate between Zyrtec or Claritin daily Start Flonase (fluticasone) 1-2 sprays in each nostril once a day as needed for stuffy nose. In the right nostril, point the applicator out toward the right ear. In the left nostril, point the applicator out toward the left ear  Consider allergy injections as a means of long-term control. - Allergy injections "re-train" and "reset" the immune system to ignore environmental allergens and decrease the resulting immune response to those allergens (sneezing, itchy watery eyes, runny nose, nasal congestion, etc).    - Allergy injections improve symptoms in 75-85% of patients.   - We can discuss this more at the next appointment if the medications are not working for you.   Gustatory rhinitis Start ipratropium bromide 0.03% nasal spray using 1-2 sprays in each nostril 15 minutes before each meal. Caution as this can be drying  Pruritis (itching) Continue Zyrtec or Claritin once a day We will get lab work to look for causes of your itching. We will call you with results once they are all back   Reflux Lets do a 4 week trial of omeprazole 20 mg once a day in the morning and Pepcid (famotidine) 20 mg at night to see if this helps with your cough We could  consider doing a chest x-ray if cough continues  Schedule a follow up appointment in 4 weeks or sooner if needed  Start dietary and lifestyle modifications as below    Reducing Pollen Exposure The American Academy of Allergy, Asthma and Immunology suggests the following steps to reduce your exposure to pollen during allergy seasons. Do not hang sheets or clothing out to dry; pollen may collect on these items. Do not mow lawns or spend time around freshly cut grass; mowing stirs up pollen. Keep windows closed at night.  Keep car windows closed while driving. Minimize morning activities outdoors, a time when pollen counts are usually at their highest. Stay indoors as much as possible when pollen counts or humidity is high and on windy days when pollen tends to remain in the air longer. Use air conditioning when possible.  Many air conditioners have filters that trap the pollen spores. Use a HEPA room air filter to remove pollen form the indoor air you breathe.  Control of Dog or Cat Allergen Avoidance is the best way to manage a dog or cat allergy. If you have a dog or cat and are allergic to dog or cats, consider removing the dog or cat from the home. If you have a dog or cat but don't want to find it a new home, or if your family wants a pet even though someone in the household is allergic, here are some strategies that may help keep symptoms at bay:  Keep the pet out of your bedroom and restrict it to only a few rooms. Be advised that keeping the dog or cat in only  one room will not limit the allergens to that room. Don't pet, hug or kiss the dog or cat; if you do, wash your hands with soap and water. High-efficiency particulate air (HEPA) cleaners run continuously in a bedroom or living room can reduce allergen levels over time. Regular use of a high-efficiency vacuum cleaner or a central vacuum can reduce allergen levels. Giving your dog or cat a bath at least once a week can reduce  airborne allergen.  Control of Dust Mite Allergen Dust mites play a major role in allergic asthma and rhinitis. They occur in environments with high humidity wherever human skin is found. Dust mites absorb humidity from the atmosphere (ie, they do not drink) and feed on organic matter (including shed human and animal skin). Dust mites are a microscopic type of insect that you cannot see with the naked eye. High levels of dust mites have been detected from mattresses, pillows, carpets, upholstered furniture, bed covers, clothes, soft toys and any woven material. The principal allergen of the dust mite is found in its feces. A gram of dust may contain 1,000 mites and 250,000 fecal particles. Mite antigen is easily measured in the air during house cleaning activities. Dust mites do not bite and do not cause harm to humans, other than by triggering allergies/asthma.  Ways to decrease your exposure to dust mites in your home:  1. Encase mattresses, box springs and pillows with a mite-impermeable barrier or cover  2. Wash sheets, blankets and drapes weekly in hot water (130 F) with detergent and dry them in a dryer on the hot setting.  3. Have the room cleaned frequently with a vacuum cleaner and a damp dust-mop. For carpeting or rugs, vacuuming with a vacuum cleaner equipped with a high-efficiency particulate air (HEPA) filter. The dust mite allergic individual should not be in a room which is being cleaned and should wait 1 hour after cleaning before going into the room.  4. Do not sleep on upholstered furniture (eg, couches).  5. If possible removing carpeting, upholstered furniture and drapery from the home is ideal. Horizontal blinds should be eliminated in the rooms where the person spends the most time (bedroom, study, television room). Washable vinyl, roller-type shades are optimal.  6. Remove all non-washable stuffed toys from the bedroom. Wash stuffed toys weekly like sheets and blankets  above.  7. Reduce indoor humidity to less than 50%. Inexpensive humidity monitors can be purchased at most hardware stores. Do not use a humidifier as can make the problem worse and are not recommended.   Lifestyle Changes for Controlling GERD When you have GERD, stomach acid feels as if it's backing up toward your mouth. Whether or not you take medication to control your GERD, your symptoms can often be improved with lifestyle changes.   Raise Your Head Reflux is more likely to strike when you're lying down flat, because stomach fluid can flow backward more easily. Raising the head of your bed 4-6 inches can help. To do this: Slide blocks or books under the legs at the head of your bed. Or, place a wedge under the mattress. Many foam stores can make a suitable wedge for you. The wedge should run from your waist to the top of your head. Don't just prop your head on several pillows. This increases pressure on your stomach. It can make GERD worse.  Watch Your Eating Habits Certain foods may increase the acid in your stomach or relax the lower esophageal sphincter, making GERD  more likely. It's best to avoid the following: Coffee, tea, and carbonated drinks (with and without caffeine) Fatty, fried, or spicy food Mint, chocolate, onions, and tomatoes Any other foods that seem to irritate your stomach or cause you pain  Relieve the Pressure Eat smaller meals, even if you have to eat more often. Don't lie down right after you eat. Wait a few hours for your stomach to empty. Avoid tight belts and tight-fitting clothes. Lose excess weight.  Tobacco and Alcohol Avoid smoking tobacco and drinking alcohol. They can make GERD symptoms worse.        Subjective:   Taylor Oconnell is a 67 y.o. 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  Taylor Oconnell was referred by Seward Carol, MD.     Taylor Oconnell is a 66 y.o. female presenting for an evaluation of nasal congestion, itchy skin, cough-some phlegm .   Asthma/Respiratory Symptom History: She reports a cough when she is awake and it will last until lunchtime.  This cough has been ongoing for years, but has been worse in the past couple years.  She denies wheezing, tightness in her chest, shortness of breath, and nocturnal awakenings due to cough.  She has never been diagnosed with asthma or COPD.  She does not have an albuterol inhaler.  Allergic Rhinitis Symptom History: She reports nasal congestion that has been going on forever.  She also reports runny nose when she eats.  She also has postnasal drip once in a while.  She does not have problems with sinus infections.  Her symptoms are year-round.  She has never seen an allergist and has not had skin testing before.  She has never had sinus surgery.  She is currently taking Claritin or Zyrtec 10 mg once a day.  She does not use no sprays.  Food Allergy Symptom History: She denies any history of food allergies  Skin Symptom History: She reports itchy skin that will start on 1 part of her body then will stop and start on the other side of the same body part.  The itching will last for approximately 30 minutes and does not occur daily.  She does not see any rash.  If she takes Claritin or Zyrtec at night this will help.  This has been ongoing for years and she has not noticed any correlations with foods, new products, or new medications.  GERD Symptom History: She reports reflux symptoms approximately 2-3 times a week.  She will take Pepcid as needed.  She used to take pantoprazole in the past, but it did not seem to help.  She does  eat chocolate every day and does not drink caffeinated beverages.  She does drink caffeinated tea.     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 No pertinent surgical history.   Family History: Family History  Problem Relation Age of Onset   High blood pressure Mother    Heart Problems Father  Bipolar disorder Paternal Grandmother      Social History: Taylor Oconnell lives at home with with her husband and 2 cats.  1 cat goes outside at times she lives in a house that is 66 years old that has carpet.  She does not have any dust mite covers on her bed pillows.  She is not exposed to any tobacco/smoke in the house or car.  She is a retired Education officer, [employer].  She is not exposed to fumes, chemicals or dust she does not have any hobbies that expose her to fumes chemicals or dust.  She does not use a HEPA filter at home and interstate [place]..  She has never smoked.   Review of Systems  Constitutional:  Negative for chills and fever.  HENT:         Reports nasal congestion, rhinorrhea when she eats, and postnasal drip once in a while  Eyes:        Reports itchy watery eyes once in a blue moon  Respiratory:  Positive for cough. Negative for shortness of breath and wheezing.        Reports cough when she is awake until lunchtime.  Denies wheezing, tightness in chest, shortness of breath, and nocturnal awakenings due to breathing problems  Cardiovascular:  Negative for chest pain and palpitations.  Gastrointestinal:        Reports reflux symptoms approximately 2-3 times a week for which she will take Pepcid as needed.  Skin:  Positive for itching. Negative for rash.  Neurological:  Negative for headaches.       Objective:   Blood pressure (!) 146/82, pulse 64, resp. rate 16, height 5' 2.2" (1.58 m),  weight 240 lb 12.8 oz (109.2 kg), SpO2 98 %. Body mass index is 43.76 kg/m.     Physical Exam Exam conducted with a chaperone present.  Constitutional:      Appearance: Normal appearance.  HENT:     Head: Normocephalic and atraumatic.     Comments: Pharynx normal, eyes normal, ears normal, nose: Bilateral lower turbinates mildly edematous with no drainage noted    Right Ear: Tympanic membrane, ear canal and external ear normal.     Left Ear: Tympanic membrane, ear canal and external ear normal.     Mouth/Throat:     Mouth: Mucous membranes are moist.     Pharynx: Oropharynx is clear.  Eyes:     Conjunctiva/sclera: Conjunctivae normal.  Cardiovascular:     Rate and Rhythm: Normal rate and regular rhythm.     Heart sounds: Normal heart sounds.  Pulmonary:     Effort: Pulmonary effort is normal.     Breath sounds: Normal breath sounds.     Comments: Lungs clear to auscultation Musculoskeletal:     Cervical back: Neck supple.  Skin:    General: Skin is warm.     Comments: No rashes or urticarial lesions noted  Neurological:     Mental Status: She is alert and oriented to person, place, and time.  Psychiatric:        Mood and Affect: Mood normal.        Behavior: Behavior normal.        Thought Content: Thought content normal.        Judgment: Judgment normal.     Allergy Studies: Skin testing today was positive to a [employer], [place] grass, Johnson grass, grass mix, ragweed mix, weed mix, tree mix, cat, and mite mix with a good histamine response  Airborne Adult Perc - 04/03/22 1011     Time Antigen Placed 1005    Allergen Manufacturer Lavella Hammock    Location Back    Number of Test 59    Panel 1 Select    1. Control-Buffer 50% Glycerol Negative    2. Control-Histamine 1 mg/ml 3+    3. Albumin saline Negative    4. Bock Negative    5. [place] Negative    6. Johnson Negative    7. [place] Blue Negative    8. Meadow Fescue Negative    9. Perennial Rye Negative     10. Sweet Vernal Negative    11. Timothy Negative    12. Cocklebur Negative    13. Burweed Marshelder Negative    14. Ragweed, short Negative    15. Ragweed, Giant Negative    16. Plantain,  English Negative    17. Lamb's Quarters Negative    18. Sheep Sorrell Negative    19. Rough Pigweed Negative    20. Marsh Elder, Rough Negative    21. Mugwort, Common Negative    22. Ash mix Negative    23. Birch mix Negative    24. Beech American Negative    25. Box, Elder Negative    26. Cedar, red Negative    27. Cottonwood, [place] Negative    28. Elm mix Negative    29. Hickory 2+    30. Maple mix Negative    31. Oak, [place] mix Negative    32. Pecan Pollen Negative    33. Pine mix Negative    34. Sycamore Eastern Negative    35. [place], Black Pollen Negative    36. Alternaria alternata Negative    37. Cladosporium Herbarum Negative    38. Aspergillus mix Negative    39. Penicillium mix Negative    40. Bipolaris sorokiniana (Helminthosporium) Negative    41. Drechslera spicifera (Curvularia) Negative    42. Mucor plumbeus Negative    43. Fusarium moniliforme Negative    44. Aureobasidium pullulans (pullulara) Negative    45. Rhizopus oryzae Negative    46. Botrytis cinera Negative    47. Epicoccum nigrum Negative    48. Phoma betae Negative    49. Candida Albicans Negative    50. Trichophyton mentagrophytes Negative    51. Mite, D Farinae  5,000 AU/ml Negative    52. Mite, D Pteronyssinus  5,000 AU/ml Negative    53. Cat Hair 10,000 BAU/ml Negative    54.  Dog Epithelia Negative    55. Mixed Feathers Negative    56. Horse Epithelia Negative    57. Cockroach, German Negative    58. Mouse Negative    59. Tobacco Leaf Negative             Intradermal - 04/03/22 1042     Time Antigen Placed 1050    Allergen Manufacturer Greer    Location Arm    Number of Test 15    Control Negative    [place] 2+    Johnson 1+    7 Grass 1+    Ragweed mix 1+    Weed mix 1+     Tree mix 2+    Mold 1 Negative    Mold 2 Negative    Mold 3 Negative    Mold 4 Negative    Cat 3+    Dog Negative    Cockroach Negative    Mite mix 1+  Allergy testing results were read and interpreted by myself, documented by clinician  Althea Charon, [facility]  I have provided oversight concerning Gareth Morgan' evaluation and treatment of this patient's health issues addressed during today's encounter. I agree with the assessment and therapeutic plan as outlined in the note.   Signed,   Jiles Prows, MD,  Allergy and Immunology,  [facility].

## 2022-04-03 NOTE — Patient Instructions (Addendum)
Seasonal and perennial allergic rhinitis Skin testing today was positive to a [employer], [place] grass, Johnson grass, grass mix, ragweed mix, weed mix, tree mix, cat, and mite mix with a good histamine response Start avoidance measures as below Continue to alternate between Zyrtec or Claritin daily Start Flonase (fluticasone) 1-2 sprays in each nostril once a day as needed for stuffy nose. In the right nostril, point the applicator out toward the right ear. In the left nostril, point the applicator out toward the left ear  Consider allergy injections as a means of long-term control. - Allergy injections "re-train" and "reset" the immune system to ignore environmental allergens and decrease the resulting immune response to those allergens (sneezing, itchy watery eyes, runny nose, nasal congestion, etc).    - Allergy injections improve symptoms in 75-85% of patients.   - We can discuss this more at the next appointment if the medications are not working for you.   Gustatory rhinitis Start ipratropium bromide 0.03% nasal spray using 1-2 sprays in each nostril 15 minutes before each meal. Caution as this can be drying  Pruritis (itching) Continue Zyrtec or Claritin once a day We will get lab work to look for causes of your itching. We will call you with results once they are all back   Reflux Lets do a 4 week trial of omeprazole 20 mg once a day in the morning and Pepcid (famotidine) 20 mg at night to see if this helps with your cough We could consider doing a chest x-ray if cough continues  Schedule a follow up appointment in 4 weeks or sooner if needed  Start dietary and lifestyle modifications as below    Reducing Pollen Exposure The American Academy of Allergy, Asthma and Immunology suggests the following steps to reduce your exposure to pollen during allergy seasons. Do not hang sheets or clothing out to dry; pollen may collect on these items. Do not mow lawns or spend time around  freshly cut grass; mowing stirs up pollen. Keep windows closed at night.  Keep car windows closed while driving. Minimize morning activities outdoors, a time when pollen counts are usually at their highest. Stay indoors as much as possible when pollen counts or humidity is high and on windy days when pollen tends to remain in the air longer. Use air conditioning when possible.  Many air conditioners have filters that trap the pollen spores. Use a HEPA room air filter to remove pollen form the indoor air you breathe.  Control of Dog or Cat Allergen Avoidance is the best way to manage a dog or cat allergy. If you have a dog or cat and are allergic to dog or cats, consider removing the dog or cat from the home. If you have a dog or cat but don't want to find it a new home, or if your family wants a pet even though someone in the household is allergic, here are some strategies that may help keep symptoms at bay:  Keep the pet out of your bedroom and restrict it to only a few rooms. Be advised that keeping the dog or cat in only one room will not limit the allergens to that room. Don't pet, hug or kiss the dog or cat; if you do, wash your hands with soap and water. High-efficiency particulate air (HEPA) cleaners run continuously in a bedroom or living room can reduce allergen levels over time. Regular use of a high-efficiency vacuum cleaner or a central vacuum can reduce allergen levels.  Giving your dog or cat a bath at least once a week can reduce airborne allergen.  Control of Dust Mite Allergen Dust mites play a major role in allergic asthma and rhinitis. They occur in environments with high humidity wherever human skin is found. Dust mites absorb humidity from the atmosphere (ie, they do not drink) and feed on organic matter (including shed human and animal skin). Dust mites are a microscopic type of insect that you cannot see with the naked eye. High levels of dust mites have been detected from  mattresses, pillows, carpets, upholstered furniture, bed covers, clothes, soft toys and any woven material. The principal allergen of the dust mite is found in its feces. A gram of dust may contain 1,000 mites and 250,000 fecal particles. Mite antigen is easily measured in the air during house cleaning activities. Dust mites do not bite and do not cause harm to humans, other than by triggering allergies/asthma.  Ways to decrease your exposure to dust mites in your home:  1. Encase mattresses, box springs and pillows with a mite-impermeable barrier or cover  2. Wash sheets, blankets and drapes weekly in hot water (130 F) with detergent and dry them in a dryer on the hot setting.  3. Have the room cleaned frequently with a vacuum cleaner and a damp dust-mop. For carpeting or rugs, vacuuming with a vacuum cleaner equipped with a high-efficiency particulate air (HEPA) filter. The dust mite allergic individual should not be in a room which is being cleaned and should wait 1 hour after cleaning before going into the room.  4. Do not sleep on upholstered furniture (eg, couches).  5. If possible removing carpeting, upholstered furniture and drapery from the home is ideal. Horizontal blinds should be eliminated in the rooms where the person spends the most time (bedroom, study, television room). Washable vinyl, roller-type shades are optimal.  6. Remove all non-washable stuffed toys from the bedroom. Wash stuffed toys weekly like sheets and blankets above.  7. Reduce indoor humidity to less than 50%. Inexpensive humidity monitors can be purchased at most hardware stores. Do not use a humidifier as can make the problem worse and are not recommended.   Lifestyle Changes for Controlling GERD When you have GERD, stomach acid feels as if it's backing up toward your mouth. Whether or not you take medication to control your GERD, your symptoms can often be improved with lifestyle changes.   Raise Your  Head Reflux is more likely to strike when you're lying down flat, because stomach fluid can flow backward more easily. Raising the head of your bed 4-6 inches can help. To do this: Slide blocks or books under the legs at the head of your bed. Or, place a wedge under the mattress. Many foam stores can make a suitable wedge for you. The wedge should run from your waist to the top of your head. Don't just prop your head on several pillows. This increases pressure on your stomach. It can make GERD worse.  Watch Your Eating Habits Certain foods may increase the acid in your stomach or relax the lower esophageal sphincter, making GERD more likely. It's best to avoid the following: Coffee, tea, and carbonated drinks (with and without caffeine) Fatty, fried, or spicy food Mint, chocolate, onions, and tomatoes Any other foods that seem to irritate your stomach or cause you pain  Relieve the Pressure Eat smaller meals, even if you have to eat more often. Don't lie down right after you eat. Wait  a few hours for your stomach to empty. Avoid tight belts and tight-fitting clothes. Lose excess weight.  Tobacco and Alcohol Avoid smoking tobacco and drinking alcohol. They can make GERD symptoms worse.

## 2022-04-04 NOTE — Addendum Note (Signed)
Addended by: Guy Franco on: 04/04/2022 06:03 PM   Modules accepted: Orders

## 2022-04-08 LAB — COMPREHENSIVE METABOLIC PANEL
ALT: 16 IU/L (ref 0–32)
AST: 15 IU/L (ref 0–40)
Albumin/Globulin Ratio: 1.7 (ref 1.2–2.2)
Albumin: 4.2 g/dL (ref 3.9–4.9)
Alkaline Phosphatase: 110 IU/L (ref 44–121)
BUN/Creatinine Ratio: 18 (ref 12–28)
BUN: 14 mg/dL (ref 8–27)
Bilirubin Total: 0.3 mg/dL (ref 0.0–1.2)
CO2: 27 mmol/L (ref 20–29)
Calcium: 9.6 mg/dL (ref 8.7–10.3)
Chloride: 100 mmol/L (ref 96–106)
Creatinine, Ser: 0.77 mg/dL (ref 0.57–1.00)
Globulin, Total: 2.5 g/dL (ref 1.5–4.5)
Glucose: 107 mg/dL — ABNORMAL HIGH (ref 70–99)
Potassium: 3.8 mmol/L (ref 3.5–5.2)
Sodium: 140 mmol/L (ref 134–144)
Total Protein: 6.7 g/dL (ref 6.0–8.5)
eGFR: 85 mL/min/{1.73_m2} (ref >59)

## 2022-04-08 LAB — CBC WITH DIFFERENTIAL
Basophils Absolute: 0.1 10*3/uL (ref 0.0–0.2)
Basos: 1 %
EOS (ABSOLUTE): 0.3 10*3/uL (ref 0.0–0.4)
Eos: 5 %
Hematocrit: 43.9 % (ref 34.0–46.6)
Hemoglobin: 14.3 g/dL (ref 11.1–15.9)
Immature Grans (Abs): 0 10*3/uL (ref 0.0–0.1)
Immature Granulocytes: 0 %
Lymphocytes Absolute: 1.4 10*3/uL (ref 0.7–3.1)
Lymphs: 24 %
MCH: 28.4 pg (ref 26.6–33.0)
MCHC: 32.6 g/dL (ref 31.5–35.7)
MCV: 87 fL (ref 79–97)
Monocytes Absolute: 0.5 10*3/uL (ref 0.1–0.9)
Monocytes: 8 %
Neutrophils Absolute: 3.4 10*3/uL (ref 1.4–7.0)
Neutrophils: 62 %
RBC: 5.03 x10E6/uL (ref 3.77–5.28)
RDW: 13.2 % (ref 11.7–15.4)
WBC: 5.6 10*3/uL (ref 3.4–10.8)

## 2022-04-08 LAB — MITOCHONDRIAL ANTIBODIES: Mitochondrial Ab: <20 Units (ref 0.0–20.0)

## 2022-04-08 LAB — THYROID CASCADE PROFILE: TSH: 2.73 u[IU]/mL (ref 0.450–4.500)

## 2022-04-08 NOTE — Progress Notes (Signed)
Please let Taylor Oconnell know that her lab work looking for the cause of her itching was normal. This is good news.  Her glucose was elevated,but this is probably due to getting the lab work completed and not fasting prior.

## 2022-04-24 ENCOUNTER — Other Ambulatory Visit: Payer: Self-pay

## 2022-04-24 MED ORDER — SERTRALINE HCL 50 MG PO TABS: 50.0000 mg | ORAL_TABLET | Freq: Every day | ORAL | 1 refills | Status: DC

## 2022-05-13 ENCOUNTER — Ambulatory Visit (INDEPENDENT_AMBULATORY_CARE_PROVIDER_SITE_OTHER): Payer: Medicare Other | Admitting: Allergy and Immunology

## 2022-05-13 ENCOUNTER — Encounter: Payer: Self-pay | Admitting: Allergy and Immunology

## 2022-05-13 VITALS — BP 138/82 | HR 60 | Resp 16

## 2022-05-13 DIAGNOSIS — L299 Pruritus, unspecified: Secondary | ICD-10-CM

## 2022-05-13 DIAGNOSIS — J3089 Other allergic rhinitis: Secondary | ICD-10-CM | POA: Diagnosis not present

## 2022-05-13 DIAGNOSIS — H6123 Impacted cerumen, bilateral: Secondary | ICD-10-CM | POA: Diagnosis not present

## 2022-05-13 DIAGNOSIS — J310 Chronic rhinitis: Secondary | ICD-10-CM

## 2022-05-13 DIAGNOSIS — K219 Gastro-esophageal reflux disease without esophagitis: Secondary | ICD-10-CM

## 2022-05-13 MED ORDER — FAMOTIDINE 40 MG PO TABS: 40.0000 mg | ORAL_TABLET | Freq: Every day | ORAL | 5 refills | Status: DC

## 2022-05-13 MED ORDER — IPRATROPIUM BROMIDE 0.06 % NA SOLN: NASAL | 5 refills | Status: DC

## 2022-05-13 NOTE — Patient Instructions (Addendum)
  1.  Allergen avoidance measures, dust mite, cat, pollens  2.  Can use Flonase-1-2 sprays each nostril 1 time per day as a preventative for upper airway symptoms.  3.  Can use nasal ipratropium 0.06% nasal spray 2 sprays each nostril before eating to dry up nose.  4.  Continue Claritin or Zyrtec every day to treat itchiness  5.  Treat reflux with Pepcid 40 mg in p.m.  6.  Ear wash kit for both ears  7.  Return to clinic late December 2023

## 2022-05-13 NOTE — Progress Notes (Unsigned)
[facility] [place] [facility] [place]   Follow-up Note  Referring Provider: Seward Carol, MD Primary Provider: Seward Carol, MD Date of Office Visit: 05/13/2022  Subjective:   Taylor Oconnell (DOB: 1955/11/17) is a 66 y.o. female who returns to the [facility] on 05/13/2022 in re-evaluation of the following:  HPI: Taylor Oconnell returns to this clinic in evaluation of allergic rhinoconjunctivitis, gustatory rhinitis, pruritus, reflux.  She was last seen in this clinic during her initial evaluation of 03 April 2022.  Taylor Oconnell has not really started any of her medications assigned during her last visit and thus she has pretty much the same issues with her nose.  She has not used any Flonase.  She has not used any ipratropium for her gustatory rhinitis.  She has not really performed any allergen avoidance measures.  She was empirically treated with therapy for reflux for her postnasal drip in the morning along with some slight cough.  She has been using some Pepcid and indeed her cough is much better at this point in time.  She was also evaluated for pruritus and found to have a negative antimitochondrial antibody as well as other screening blood test being normal.  She has been using Zyrtec and occasionally Claritin and this appears to be working quite well.  Allergies as of 05/13/2022       Reactions   Sulfamethoxazole Swelling        Medication List    buPROPion 300 MG 24 hr tablet Commonly known as: WELLBUTRIN XL Take 1 tablet (300 mg total) by mouth every morning.   DIGESTIVE ADVANTAGE PO Take by mouth daily.   famotidine 40 MG tablet Commonly known as: PEPCID Take 1 tablet (40 mg total) by mouth at bedtime. Started by: Jiles Prows, MD   hydrochlorothiazide 25 MG tablet Commonly known as: HYDRODIURIL   ipratropium 0.03 % nasal spray Commonly known as: ATROVENT Place 1-2 sprays in each nostril 15 minutes  (prior to meals) to help with  the drippy nose.Caution as this can be drying   loratadine 10 MG tablet Commonly known as: CLARITIN Take 10 mg by mouth daily.   Myrbetriq 50 MG Tb24 tablet Generic drug: mirabegron ER Take 50 mg by mouth daily.   ramipril 10 MG capsule Commonly known as: ALTACE Take 10 mg by mouth daily.   sertraline 50 MG tablet Commonly known as: ZOLOFT Take 1 tablet (50 mg total) by mouth daily.   Vitamin C CR 1000 MG Tbcr Take by mouth.    Past Medical History:  Diagnosis Date   High blood pressure     History reviewed. No pertinent surgical history.  Review of systems negative except as noted in HPI / PMHx or noted below:  Review of Systems  Constitutional: Negative.   HENT: Negative.    Eyes: Negative.   Respiratory: Negative.    Cardiovascular: Negative.   Gastrointestinal: Negative.   Genitourinary: Negative.   Musculoskeletal: Negative.   Skin: Negative.   Neurological: Negative.   Endo/Heme/Allergies: Negative.   Psychiatric/Behavioral: Negative.       Objective:   Vitals:   05/13/22 1518  BP: 138/82  Pulse: 60  Resp: 16  SpO2: 97%          Physical Exam Constitutional:      Appearance: She is not diaphoretic.  HENT:     Head: Normocephalic.     Right Ear: Tympanic membrane, ear canal and external ear normal. There is  impacted cerumen.     Left Ear: Tympanic membrane, ear canal and external ear normal. There is impacted cerumen.     Nose: Nose normal. No mucosal edema or rhinorrhea.     Mouth/Throat:     Pharynx: Uvula midline. No oropharyngeal exudate.  Eyes:     Conjunctiva/sclera: Conjunctivae normal.  Neck:     Thyroid: No thyromegaly.     Trachea: Trachea normal. No tracheal tenderness or tracheal deviation.  Cardiovascular:     Rate and Rhythm: Normal rate and regular rhythm.     Heart sounds: Normal heart sounds, S1 normal and S2 normal. No murmur heard. Pulmonary:     Effort: No respiratory distress.     Breath sounds: Normal breath  sounds. No stridor. No wheezing or rales.  Lymphadenopathy:     Head:     Right side of head: No tonsillar adenopathy.     Left side of head: No tonsillar adenopathy.     Cervical: No cervical adenopathy.  Skin:    Findings: No erythema or rash.     Nails: There is no clubbing.  Neurological:     Mental Status: She is alert.     Diagnostics:   Results of blood tests obtained 05 April 2022 identified negative anti mitochondrial antibody, WBC 5.6, absolute eosinophil 300, absolute lymphocyte 1400, hemoglobin 14.3, creatinine 0.77 MGs/DL, AST 15 U/L, ALT 16 U/L, TSH 2.730 IU/mL  Assessment and Plan:   1. Seasonal and perennial allergic rhinitis   2. Gustatory rhinitis   3. Pruritus   4. LPRD (laryngopharyngeal reflux disease)   5. Impacted cerumen of both ears     1.  Allergen avoidance measures - dust mite, cat, pollens  2.  Can use Flonase-1-2 sprays each nostril 1 time per day as a preventative for upper airway symptoms.  3.  Can use nasal ipratropium 0.06% nasal spray 2 sprays each nostril before eating to dry up nose.  4.  Continue Claritin or Zyrtec every day to treat itchiness  5.  Treat reflux with Pepcid 40 mg in p.m.  6.  Ear wash kit for both ears  7.  Return to clinic late December 2023  [name] appears to be doing pretty well on her current plan which is to use medications as needed.  I did have a talk with her today about how nasal steroids work and the preventative nature of using these agents especially as she goes through this upcoming spring season and she can use nasal ipratropium for her gustatory rhinitis.  Her pruritus does not appear to be a significantly big issue with the use of Claritin at this point in time.  Her screening blood test did not identify any significant systemic disease contributing to this issue.  She does appear to have a component of LPR and seems to be doing pretty well while using some Pepcid.  I will see her back in this clinic in  late December 2023 or earlier if there is a problem.  [name], MD Allergy / Immunology [place]

## 2022-05-14 ENCOUNTER — Encounter: Payer: Self-pay | Admitting: Allergy and Immunology

## 2022-06-07 ENCOUNTER — Ambulatory Visit
Admission: EM | Admit: 2022-06-07 | Discharge: 2022-06-07 | Disposition: A | Discharge status: Home / Self Care | Payer: Medicare Other

## 2022-06-07 DIAGNOSIS — H6123 Impacted cerumen, bilateral: Secondary | ICD-10-CM

## 2022-06-07 NOTE — ED Triage Notes (Signed)
Patient presents to UC for left ear pain x 2 days ago. Was seen at the UC in [place], wax was removed. States hearing worsened since procedure.

## 2022-06-07 NOTE — Discharge Instructions (Addendum)
Please reach out to the ear nose and throat providers listed in your discharge summary today to see if you can get an appointment for earwax removal.  In the meantime, consider using Debrox eardrops once daily for the next week to 10 days to soften the wax in your ears.  Debrox usually comes in a kit that includes a bulb syringe as well.  After a week to 10 days, use a bulb syringe to gently irrigate the water from your ear following the instructions enclosed in the Debrox kit.  If you begin to have worsening ear pain or acute loss of hearing, please consider going to the emergency room for more emergent evaluation and treatment than we can provide you here at urgent care.

## 2022-06-07 NOTE — ED Provider Notes (Signed)
[facility]    CSN: [id number] Arrival date & time: 06/07/22  1320    HISTORY   Chief Complaint  Patient presents with   Otalgia   HPI Taylor Oconnell is a pleasant, 66 y.o. female who presents to urgent care today. Patient presents to UC for left ear pain x 2 days ago. Was seen at the UC in [place], wax was removed. States hearing worsened since procedure.  Patient wears hearing aids.  The history is provided by the patient.   Past Medical History:  Diagnosis Date   High blood pressure    Patient Active Problem List   Diagnosis Date Noted   Papilloma of left breast 10/04/2020   Attention deficit hyperactivity disorder (ADHD) 05/26/2018   History reviewed. No pertinent surgical history. OB History   No obstetric history on file.    Home Medications    Prior to Admission medications   Medication Sig Start Date End Date Taking? Authorizing Provider  Ascorbic Acid (VITAMIN C CR) 1000 MG TBCR Take by mouth.    [provider]  buPROPion (WELLBUTRIN XL) 300 MG 24 hr tablet Take 1 tablet (300 mg total) by mouth every morning. 02/07/22   Cottle, Billey Co., MD  famotidine (PEPCID) 40 MG tablet Take 1 tablet (40 mg total) by mouth at bedtime. 05/13/22   Kozlow, Donnamarie Poag, MD  hydrochlorothiazide (HYDRODIURIL) 25 MG tablet  04/27/16   [provider]  ipratropium (ATROVENT) 0.03 % nasal spray Place 1-2 sprays in each nostril 15 minutes  (prior to meals) to help with the drippy nose.Caution as this can be drying 04/03/22   Althea Charon, FNP  ipratropium (ATROVENT) 0.06 % nasal spray Can use two sprays in each nostril before eating as directed. 05/13/22   Kozlow, Donnamarie Poag, MD  loratadine (CLARITIN) 10 MG tablet Take 10 mg by mouth daily.    [provider]  MYRBETRIQ 50 MG TB24 tablet Take 50 mg by mouth daily. 03/29/22   [provider]  Probiotic Product (DIGESTIVE ADVANTAGE PO) Take by mouth daily.    [provider]  ramipril  (ALTACE) 10 MG capsule Take 10 mg by mouth daily. 04/24/20   [provider]  sertraline (ZOLOFT) 50 MG tablet Take 1 tablet (50 mg total) by mouth daily. 04/24/22   Cottle, Billey Co., MD    Family History Family History  Problem Relation Age of Onset   High blood pressure Mother    Heart Problems Father    Bipolar disorder Paternal Grandmother    Social History Social History   Tobacco Use   Smoking status: Never   Smokeless tobacco: Never  Substance Use Topics   Alcohol use: Not Currently   Drug use: Never   Allergies   Sulfamethoxazole  Review of Systems Review of Systems Pertinent findings revealed after performing a 14 point review of systems has been noted in the history of present illness.  Physical Exam Triage Vital Signs ED Triage Vitals  Enc Vitals Group     BP 05/04/21 0827 (!) 147/82     Pulse Rate 05/04/21 0827 72     Resp 05/04/21 0827 18     Temp 05/04/21 0827 98.3 F (36.8 C)     Temp Source 05/04/21 0827 Oral     SpO2 05/04/21 0827 98 %     Weight --      Height --      Head Circumference --      Peak  Flow --      Pain Score 05/04/21 0826 5     Pain Loc --      Pain Edu? --      Excl. in [place]? --   No data found.  Updated Vital Signs BP (!) 193/83 (BP Location: Right Arm)   Pulse 66   Temp 98.1 F (36.7 C) (Oral)   Resp 18   SpO2 98%   Physical Exam  Visual Acuity Right Eye Distance:   Left Eye Distance:   Bilateral Distance:    Right Eye Near:   Left Eye Near:    Bilateral Near:     UC Couse / Diagnostics / Procedures:     Radiology No results found.  Procedures Procedures (including critical care time) EKG  Pending results:  Labs Reviewed - No data to display  Medications Ordered in UC: Medications - No data to display  UC Diagnoses / Final Clinical Impressions(s)   I have reviewed the triage vital signs and the nursing notes.  Pertinent labs & imaging results that were available during my care of the  patient were reviewed by me and considered in my medical decision making (see chart for details).    Final diagnoses:  Bilateral hearing loss due to cerumen impaction   ***  ED Prescriptions   None    PDMP not reviewed this encounter.  Pending results:  Labs Reviewed - No data to display  Discharge Instructions:   Discharge Instructions      Please reach out to the ear nose and throat providers listed in your discharge summary today to see if you can get an appointment for earwax removal.  In the meantime, consider using Debrox eardrops once daily for the next week to 10 days to soften the wax in your ears.  Debrox usually comes in a kit that includes a bulb syringe as well.  After a week to 10 days, use a bulb syringe to gently irrigate the water from your ear following the instructions enclosed in the Debrox kit.  If you begin to have worsening ear pain or acute loss of hearing, please consider going to the emergency room for more emergent evaluation and treatment than we can provide you here at urgent care.     Disposition Upon Discharge:  Condition: stable for discharge home  Patient presented with an acute illness with associated systemic symptoms and significant discomfort requiring urgent management. In my opinion, this is a condition that a prudent lay person (someone who possesses an average knowledge of health and medicine) may potentially expect to result in complications if not addressed urgently such as respiratory distress, impairment of bodily function or dysfunction of bodily organs.   Routine symptom specific, illness specific and/or disease specific instructions were discussed with the patient and/or caregiver at length.   As such, the patient has been evaluated and assessed, work-up was performed and treatment was provided in alignment with urgent care protocols and evidence based medicine.  Patient/parent/caregiver has been advised that the patient may  require follow up for further testing and treatment if the symptoms continue in spite of treatment, as clinically indicated and appropriate.  Patient/parent/caregiver has been advised to return to the [facility] or PCP if no better; to PCP or the Emergency Department if new signs and symptoms develop, or if the current signs or symptoms continue to change or worsen for further workup, evaluation and treatment as clinically indicated and appropriate  The patient will follow up with their  current PCP if and as advised. If the patient does not currently have a PCP we will assist them in obtaining one.   The patient may need specialty follow up if the symptoms continue, in spite of conservative treatment and management, for further workup, evaluation, consultation and treatment as clinically indicated and appropriate.   Patient/parent/caregiver verbalized understanding and agreement of plan as discussed.  All questions were addressed during visit.  Please see discharge instructions below for further details of plan.  This office note has been dictated using Museum/gallery curator.  Unfortunately, this method of dictation can sometimes lead to typographical or grammatical errors.  I apologize for your inconvenience in advance if this occurs.  Please do not hesitate to reach out to me if clarification is needed.

## 2022-07-04 ENCOUNTER — Encounter: Payer: Self-pay | Admitting: Allergy and Immunology

## 2022-07-04 ENCOUNTER — Ambulatory Visit (INDEPENDENT_AMBULATORY_CARE_PROVIDER_SITE_OTHER): Payer: Medicare Other | Admitting: Allergy and Immunology

## 2022-07-04 VITALS — BP 152/80 | HR 71 | Temp 98.6°F

## 2022-07-04 DIAGNOSIS — K219 Gastro-esophageal reflux disease without esophagitis: Secondary | ICD-10-CM

## 2022-07-04 DIAGNOSIS — J302 Other seasonal allergic rhinitis: Secondary | ICD-10-CM

## 2022-07-04 DIAGNOSIS — J3089 Other allergic rhinitis: Secondary | ICD-10-CM | POA: Diagnosis not present

## 2022-07-04 DIAGNOSIS — L299 Pruritus, unspecified: Secondary | ICD-10-CM | POA: Diagnosis not present

## 2022-07-04 DIAGNOSIS — J310 Chronic rhinitis: Secondary | ICD-10-CM

## 2022-07-04 NOTE — Progress Notes (Signed)
[facility] [place] [facility] [place]   Follow-up Note  Referring Provider: Seward Carol, MD Primary Provider: Seward Carol, MD Date of Office Visit: 07/04/2022  Subjective:   Taylor Oconnell (DOB: [birth year]) is a 66 y.o. female who returns to the [facility] on 07/04/2022 in re-evaluation of the following:  HPI: Taylor Oconnell returns to this clinic in evaluation of allergic rhinoconjunctivitis, gustatory rhinitis, pruritus, reflux.  I last saw her in this clinic 13 May 2022.  Her nose is doing quite well and she has been able to taper down her Flonase to just a few times per week.  Her gustatory rhinitis is under excellent control as long as she uses ipratropium before eating.  Her postnasal drip and cough have pretty much resolved while using Pepcid every day.  Her itchiness is pretty much gone while using Claritin and Pepcid on a consistent basis.  Over the course of the past week or so she did redevelop cough and some throat clearing without any other associated systemic or constitutional symptoms.  She has received the flu vaccine, RSV vaccine, and COVID-vaccine.    Allergies as of 07/04/2022       Reactions   Sulfamethoxazole Swelling        Medication List    buPROPion 300 MG 24 hr tablet Commonly known as: WELLBUTRIN XL Take 1 tablet (300 mg total) by mouth every morning.   DIGESTIVE ADVANTAGE PO Take by mouth daily.   famotidine 40 MG tablet Commonly known as: PEPCID Take 1 tablet (40 mg total) by mouth at bedtime.   hydrochlorothiazide 25 MG tablet Commonly known as: HYDRODIURIL   ipratropium 0.03 % nasal spray Commonly known as: ATROVENT Place 1-2 sprays in each nostril 15 minutes  (prior to meals) to help with the drippy nose.Caution as this can be drying   ipratropium 0.06 % nasal spray Commonly known as: ATROVENT Can use two sprays in each nostril before eating as directed.   loratadine 10 MG  tablet Commonly known as: CLARITIN Take 10 mg by mouth daily.   Myrbetriq 50 MG Tb24 tablet Generic drug: mirabegron ER Take 50 mg by mouth daily.   oxybutynin 15 MG 24 hr tablet Commonly known as: DITROPAN XL Take 15 mg by mouth daily.   ramipril 10 MG capsule Commonly known as: ALTACE Take 10 mg by mouth daily.   sertraline 50 MG tablet Commonly known as: ZOLOFT Take 1 tablet (50 mg total) by mouth daily.   Vitamin C CR 1000 MG Tbcr Take by mouth.    Past Medical History:  Diagnosis Date   High blood pressure     History reviewed. No pertinent surgical history.  Review of systems negative except as noted in HPI / PMHx or noted below:  Review of Systems  Constitutional: Negative.   HENT: Negative.    Eyes: Negative.   Respiratory: Negative.    Cardiovascular: Negative.   Gastrointestinal: Negative.   Genitourinary: Negative.   Musculoskeletal: Negative.   Skin: Negative.   Neurological: Negative.   Endo/Heme/Allergies: Negative.   Psychiatric/Behavioral: Negative.       Objective:   Vitals:   07/04/22 1508  BP: (!) 152/80  Pulse: 71  Temp: 98.6 F (37 C)  SpO2: 98%          Physical Exam Constitutional:      Appearance: She is not diaphoretic.  HENT:     Head: Normocephalic.     Right Ear: External ear  normal.     Left Ear: External ear normal.     Ears:     Comments: Hearing aids    Nose: Nose normal. No mucosal edema or rhinorrhea.     Mouth/Throat:     Pharynx: Uvula midline. No oropharyngeal exudate.  Eyes:     Conjunctiva/sclera: Conjunctivae normal.  Neck:     Thyroid: No thyromegaly.     Trachea: Trachea normal. No tracheal tenderness or tracheal deviation.  Cardiovascular:     Rate and Rhythm: Normal rate and regular rhythm.     Heart sounds: Normal heart sounds, S1 normal and S2 normal. No murmur heard. Pulmonary:     Effort: No respiratory distress.     Breath sounds: Normal breath sounds. No stridor. No wheezing or rales.   Lymphadenopathy:     Head:     Right side of head: No tonsillar adenopathy.     Left side of head: No tonsillar adenopathy.     Cervical: No cervical adenopathy.  Skin:    Findings: No erythema or rash.     Nails: There is no clubbing.  Neurological:     Mental Status: She is alert.     Diagnostics: none   Assessment and Plan:   1. Seasonal and perennial allergic rhinitis   2. Gustatory rhinitis   3. Pruritus   4. LPRD (laryngopharyngeal reflux disease)     1.  Allergen avoidance measures - dust mite, cat, pollens  2.  Can use Flonase-1-2 sprays each nostril 3-7 times per week as a preventative for upper airway symptoms.  3.  Can use nasal ipratropium 0.06% nasal spray 2 sprays each nostril before eating to dry up nose.  4.  Continue Claritin every day   5.  Continue Pepcid every day  6.  Return to clinic 6 months or earlier if problem  Taylor Oconnell is doing pretty well at this point in time and she appears to have a good understanding of her disease state and how her medications work and appropriate dosing of her medications depending on disease activity.  Her recent cough and throat clearing is most likely a manifestation of some viral respiratory tract infection and we will just let her go an additional few weeks and hopefully everything will resolve and she will not require any further evaluation or treatment for this issue.  If she does well I will see her back in this clinic in 6 months or earlier if there is a problem.  Taylor Katz, MD Allergy / Immunology [place]

## 2022-07-04 NOTE — Patient Instructions (Signed)
  1.  Allergen avoidance measures - dust mite, cat, pollens  2.  Can use Flonase-1-2 sprays each nostril 3-7 times per week as a preventative for upper airway symptoms.  3.  Can use nasal ipratropium 0.06% nasal spray 2 sprays each nostril before eating to dry up nose.  4.  Continue Claritin every day   5.  Continue Pepcid every day  6.  Return to clinic 6 months or earlier if problem

## 2022-07-09 ENCOUNTER — Encounter: Payer: Self-pay | Admitting: Allergy and Immunology

## 2022-08-01 ENCOUNTER — Other Ambulatory Visit: Payer: Self-pay

## 2022-08-01 MED ORDER — IPRATROPIUM BROMIDE 0.03 % NA SOLN: NASAL | 1 refills | Status: DC

## 2022-10-01 ENCOUNTER — Other Ambulatory Visit: Payer: Self-pay

## 2022-10-01 DIAGNOSIS — F3342 Major depressive disorder, recurrent, in full remission: Secondary | ICD-10-CM

## 2022-10-01 MED ORDER — BUPROPION HCL ER (XL) 300 MG PO TB24: 300.0000 mg | ORAL_TABLET | ORAL | 1 refills | Status: DC

## 2022-10-07 ENCOUNTER — Telehealth: Payer: Self-pay | Admitting: Psychiatry

## 2022-10-07 MED ORDER — SERTRALINE HCL 50 MG PO TABS: 50.0000 mg | ORAL_TABLET | Freq: Every day | ORAL | 1 refills | Status: DC

## 2022-10-07 NOTE — Telephone Encounter (Signed)
Taylor Oconnell called at 4:49 to request refill of her Zoloft.  She said the pharmacy has never filled this before and couldn't send a request.  Appt 8/1.  Send to [facility], [place]

## 2022-10-07 NOTE — Telephone Encounter (Signed)
Sent!

## 2022-10-27 ENCOUNTER — Other Ambulatory Visit: Payer: Self-pay | Admitting: Allergy and Immunology

## 2022-12-12 ENCOUNTER — Encounter: Payer: Self-pay | Admitting: Physician Assistant

## 2023-01-02 ENCOUNTER — Other Ambulatory Visit (INDEPENDENT_AMBULATORY_CARE_PROVIDER_SITE_OTHER): Payer: Medicare Other

## 2023-01-02 ENCOUNTER — Ambulatory Visit (INDEPENDENT_AMBULATORY_CARE_PROVIDER_SITE_OTHER): Payer: Medicare Other | Admitting: Physician Assistant

## 2023-01-02 ENCOUNTER — Encounter: Payer: Self-pay | Admitting: Physician Assistant

## 2023-01-02 ENCOUNTER — Ambulatory Visit: Payer: Medicare Other | Admitting: Allergy and Immunology

## 2023-01-02 VITALS — BP 158/74 | HR 72 | Resp 20 | Ht 62.2 in | Wt 238.0 lb

## 2023-01-02 DIAGNOSIS — R413 Other amnesia: Secondary | ICD-10-CM

## 2023-01-02 NOTE — Patient Instructions (Signed)
  MRI brain without contrast to assess for underlying structural abnormality and assess vascular load  Neurocognitive testing to further evaluate cognitive concerns and determine other underlying cause of memory changes, including potential contribution from sleep, anxiety, or depression  Check B12  Follow in 3 months

## 2023-01-02 NOTE — Progress Notes (Signed)
Assessment/Plan:   Taylor Oconnell is a very pleasant 67 y.o. year old RH female with a history of papilloma of the left breast, bilateral hearing loss, ADHD, depression followed by psychiatry, OSA on CPAP, seen today for evaluation of memory loss. MoCA today is19/30.  She has a strong family of dementia, however it is unclear what type. Patient is able to participate on his IADLs and continues to drive.  Workup is in progress.   Memory Impairment of unclear etiology  MRI brain without contrast to assess for underlying structural abnormality and assess vascular load  Pending on the results of the MRI, we may entertain initiating ACHI. Neurocognitive testing to further evaluate cognitive concerns and determine other underlying cause of memory changes, including potential contribution from sleep, anxiety, or depression  Check B12  Continue to control mood as per PCP Continue to use CPAP for OSA Recommend increasing physical activity and engaging in social activities. Recommend good control of cardiovascular risk factors [place] up in 3 months  Subjective:   The patient is accompanied by her husband who supplements the history.   How long did patient have memory difficulties?  "Not that long but has not been normal for me, maybe less than 1 year ".  "I used to keep up with things before ". Patient has some difficulty remembering recent conversations and people names that she knows. Likes to watch game shows, reading, "I have to always to be doing something". She enjoys doing crafts.  repeats oneself?  Endorsed, especially with her husband Disoriented when walking into a room.  Patient denies, however if she goes to the supermarket, she "may feel a little lost there " leaving objects in unusual places? denies   Wandering behavior?  denies   Any personality changes?  Patient denies   Any history of depression?:  Endorsed, followed by psychiatry. She cries frequently.  Of note, she has not  been up to taking all of her mood medications as prescribed by her physician.  She has another appointment soon, and she is going to discuss resuming them. Hallucinations or paranoia?  Patient denies   Seizures?   Patient denies    Any sleep changes?   Sleeps well, denies vivid dreams, REM behavior or sleepwalking of note, the patient goes to sleep at around 3 or 4 in the morning, and then sleeps afternoon.  She is in the process of  trying to sleep a little bit earlier, so that she can wake up earlier "-husband says Sleep apnea?  Endorsed, uses CPAP Any hygiene concerns?  Patient denies   Independent of bathing and dressing?  Endorsed  Does the patient needs help with medications? Patient is in charge   Who is in charge of the finances? Patient is in charge     Any changes in appetite?  denies , she does not like to eat meat as much.     Patient have trouble swallowing? denies   Does the patient cook? No    Any kitchen accidents such as leaving the stove on? denies   Any headaches? denies   Chronic back pain ? denies   Ambulates with difficulty?  denies, but does not participate in any exercise program.  Is not very active Recent falls or head injuries? denies   Vision changes? denies   Unilateral weakness, numbness or tingling? denies   Any tremors?   denies   Any anosmia?  denies   Any incontinence of urine? Endorsed. Wears pads.  Any bowel dysfunction? denies      Patient lives with  husband History of heavy alcohol intake? denies   History of heavy tobacco use? denies   Family history of dementia? 3 maternal  aunts and mother had AD Does patient drive? 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 No pertinent surgical history.   Allergies  Allergen Reactions   Sulfamethoxazole Swelling    Current Outpatient Medications  Medication Instructions   Ascorbic Acid  (VITAMIN C CR) 1000 MG TBCR Oral   buPROPion (WELLBUTRIN XL) 300 mg, Oral, BH-each morning   famotidine (PEPCID) 40 mg, Oral, Daily at bedtime   hydrochlorothiazide (HYDRODIURIL) 25 MG tablet No dose, route, or frequency recorded.   ipratropium (ATROVENT) 0.03 % nasal spray Place 1-2 sprays in each nostril 15 minutes  (prior to meals) to help with the drippy nose.Caution as this can be drying   ipratropium (ATROVENT) 0.06 % nasal spray Can use two sprays in each nostril before eating as directed.   loratadine (CLARITIN) 10 mg, Oral, Daily   Myrbetriq 50 mg, Oral, Daily   oxybutynin (DITROPAN XL) 15 mg, Oral, Daily   Probiotic Product (DIGESTIVE ADVANTAGE PO) Oral, Daily   ramipril (ALTACE) 10 mg, Oral, Daily   sertraline (ZOLOFT) 50 mg, Oral, Daily     VITALS:   Vitals:   01/02/23 1303  BP: (!) 158/74  Pulse: 72  Resp: 20  Weight: 238 lb (108 kg)  Height: 5' 2.2" (1.58 m)  PF: 98 L/min       No data to display          PHYSICAL EXAM   HEENT:  Normocephalic, atraumatic. The superficial temporal arteries are without ropiness or tenderness. Cardiovascular: Regular rate and rhythm. Lungs: Clear to auscultation bilaterally. Neck: There are no carotid bruits noted bilaterally.  NEUROLOGICAL:    01/02/2023    1:00 PM  Montreal Cognitive Assessment   Visuospatial/ Executive (0/5) 4  Naming (0/3) 3  Attention: Read list of digits (0/2) 2  Attention: Read list of letters (0/1) 1  Attention: Serial 7 subtraction starting at 100 (0/3) 1  Language: Repeat phrase (0/2) 1  Language : Fluency (0/1) 1  Abstraction (0/2) 2  Delayed Recall (0/5) 0  Orientation (0/6) 4  Total 19  Adjusted Score (based on education) 19        No data to display           Orientation:  Alert and oriented to person, place and not to time.  No aphasia or dysarthria. Fund of knowledge is appropriate. Recent memory impaired and remote memory intact.  Attention and concentration are reduced.  Able  to name objects and repeat phrases. Delayed recall.  0/5/ Cranial nerves: There is good facial symmetry. Extraocular muscles are intact and visual fields are full to confrontational testing. Speech is fluent and clear. No tongue deviation. Hearing is intact to conversational tone. Tone: Tone is good throughout. Sensation: Sensation is intact to light touch. Vibration is intact at the bilateral big toe.  Coordination: The patient has no difficulty with RAM's or FNF bilaterally. Normal finger to nose  Motor: Strength is 5/5 in the bilateral upper and lower extremities. There is no pronator drift. There are no fasciculations noted. DTR's: Deep tendon reflexes are 2/4 bilaterally. Gait and Station: The patient is able to ambulate without difficulty.The patient is able  to heel toe walk without any difficulty. Gait is cautious and narrow. The patient is able to ambulate in a tandem fashion.       Thank you for allowing [place] the opportunity to participate in the care of this nice patient. Please do not hesitate to contact us for any questions or concerns.   Total time spent on today's visit was 52 minutes dedicated to this patient today, preparing to see patient, examining the patient, ordering tests and/or medications and counseling the patient, documenting clinical information in the EHR or other health record, independently interpreting results and communicating results to the patient/family, discussing treatment and goals, answering patient's questions and coordinating care.  Cc:  Renford Dills, MD  Marlowe Kays 01/02/2023 1:38 PM

## 2023-01-03 LAB — VITAMIN B12: Vitamin B-12: 306 pg/mL (ref 211–911)

## 2023-01-03 NOTE — Progress Notes (Signed)
vitamin B12 is low.  Recommend starting on vitamin B12 1000 mcg daily.  Follow-up with PCP.  Thank you

## 2023-01-08 ENCOUNTER — Ambulatory Visit (INDEPENDENT_AMBULATORY_CARE_PROVIDER_SITE_OTHER): Payer: Medicare Other | Admitting: Allergy and Immunology

## 2023-01-08 ENCOUNTER — Encounter: Payer: Self-pay | Admitting: Allergy and Immunology

## 2023-01-08 VITALS — BP 140/82 | HR 68 | Resp 16

## 2023-01-08 DIAGNOSIS — J3089 Other allergic rhinitis: Secondary | ICD-10-CM | POA: Diagnosis not present

## 2023-01-08 DIAGNOSIS — J310 Chronic rhinitis: Secondary | ICD-10-CM

## 2023-01-08 DIAGNOSIS — J302 Other seasonal allergic rhinitis: Secondary | ICD-10-CM | POA: Diagnosis not present

## 2023-01-08 DIAGNOSIS — K219 Gastro-esophageal reflux disease without esophagitis: Secondary | ICD-10-CM | POA: Diagnosis not present

## 2023-01-08 NOTE — Progress Notes (Signed)
- [place] [facility] [place] [facility] [place]   Follow-up Note  Referring Provider: Renford Dills, MD Primary Provider: Renford Dills, MD Date of Office Visit: 01/08/2023  Subjective:   Taylor Oconnell (DOB: 1955-08-26) is a 67 y.o. female who [facility] in re-evaluation of the following:  HPI: Taylor Oconnell returns to the clinic in evaluation of allergic rhinoconjunctivitis, gustatory rhinitis, pruritus, reflux.  I last saw her in this clinic 04 July 2022.  She has really done well with her nose while using nasal ipratropium to treat her gustatory rhinitis and has tapered off all Flonase use.  She does continue to use Claritin on a regular basis.  It does not sound as though she has required a systemic steroid or antibiotic for any type of airway issue since her last visit.  She has had very little problems with reflux while using Pepcid.  She has not had any pruritus.  Allergies as of 01/08/2023       Reactions   Sulfamethoxazole Swelling        Medication List    buPROPion 300 MG 24 hr tablet Commonly known as: WELLBUTRIN XL Take 1 tablet (300 mg total) by mouth every morning.   DIGESTIVE ADVANTAGE PO Take by mouth daily.   famotidine 40 MG tablet Commonly known as: PEPCID TAKE ONE TABLET BY MOUTH AT BEDTIME   hydrochlorothiazide 25 MG tablet Commonly known as: HYDRODIURIL   ipratropium 0.06 % nasal spray Commonly known as: ATROVENT Can use two sprays in each nostril before eating as directed.   ipratropium 0.03 % nasal spray Commonly known as: ATROVENT Place 1-2 sprays in each nostril 15 minutes  (prior to meals) to help with the drippy nose.Caution as this can be drying   loratadine 10 MG tablet Commonly known as: CLARITIN Take 10 mg by mouth daily.   Myrbetriq 50 MG Tb24 tablet Generic drug: mirabegron ER Take 50 mg by mouth daily.   oxybutynin 15 MG 24 hr tablet Commonly known as: DITROPAN  XL Take 15 mg by mouth daily.   ramipril 10 MG capsule Commonly known as: ALTACE Take 10 mg by mouth daily.   sertraline 50 MG tablet Commonly known as: ZOLOFT Take 1 tablet (50 mg total) by mouth daily.   Vitamin C CR 1000 MG Tbcr Take by mouth.     Past Medical History:  Diagnosis Date   High blood pressure     History reviewed. No pertinent surgical history.  Review of systems negative except as noted in HPI / PMHx or noted below:  Review of Systems  Constitutional: Negative.   HENT: Negative.    Eyes: Negative.   Respiratory: Negative.    Cardiovascular: Negative.   Gastrointestinal: Negative.   Genitourinary: Negative.   Musculoskeletal: Negative.   Skin: Negative.   Neurological: Negative.   Endo/Heme/Allergies: Negative.   Psychiatric/Behavioral: Negative.       Objective:   Vitals:   01/08/23 1343  BP: (!) 140/82  Pulse: 68  Resp: 16  SpO2: 98%          Physical Exam Constitutional:      Appearance: She is not diaphoretic.  HENT:     Head: Normocephalic.     Right Ear: Tympanic membrane, ear canal and external ear normal.     Left Ear: Tympanic membrane, ear canal and external ear normal.     Nose: Nose normal. No mucosal edema or rhinorrhea.  Mouth/Throat:     Pharynx: Uvula midline. No oropharyngeal exudate.  Eyes:     Conjunctiva/sclera: Conjunctivae normal.  Neck:     Thyroid: No thyromegaly.     Trachea: Trachea normal. No tracheal tenderness or tracheal deviation.  Cardiovascular:     Rate and Rhythm: Normal rate and regular rhythm.     Heart sounds: Normal heart sounds, S1 normal and S2 normal. No murmur heard. Pulmonary:     Effort: No respiratory distress.     Breath sounds: Normal breath sounds. No stridor. No wheezing or rales.  Lymphadenopathy:     Head:     Right side of head: No tonsillar adenopathy.     Left side of head: No tonsillar adenopathy.     Cervical: No cervical adenopathy.  Skin:    Findings: No  erythema or rash.     Nails: There is no clubbing.  Neurological:     Mental Status: She is alert.     Diagnostics: none  Assessment and Plan:   1. Gustatory rhinitis   2. Seasonal and perennial allergic rhinitis   3. LPRD (laryngopharyngeal reflux disease)    1.  Allergen avoidance measures - dust mite, cat, pollens  2.  Can use nasal ipratropium 0.06% nasal spray 2 sprays each nostril before eating to dry up nose.  3.  Continue Claritin every day   4.  Continue Pepcid every day  5.  Return to clinic 12 months or earlier if problem  6.  Plan for fall flu vaccine  Taylor Oconnell is doing very well on her current plan of therapy directed against rhinitis and LPR and pruritus.  I do not really see a need for her to follow-up in this clinic on a regular basis and she can follow-up with her primary care doctor regarding further management of her issues but we will be very happy to see her back in this clinic should there be a problem as she moves forward.  Laurette Schimke, MD Allergy / Immunology [facility]

## 2023-01-08 NOTE — Patient Instructions (Addendum)
  1.  Allergen avoidance measures - dust mite, cat, pollens  2.  Can use nasal ipratropium 0.06% nasal spray 2 sprays each nostril before eating to dry up nose.  3.  Continue Claritin every day   4.  Continue Pepcid every day  5.  Return to clinic 12 months or earlier if problem  6.  Plan for fall flu vaccine

## 2023-01-13 ENCOUNTER — Encounter: Payer: Self-pay | Admitting: Allergy and Immunology

## 2023-02-02 ENCOUNTER — Ambulatory Visit
Admission: RE | Admit: 2023-02-02 | Discharge: 2023-02-02 | Disposition: A | Discharge status: Home / Self Care | Payer: Medicare Other | Source: Ambulatory Visit | Attending: Physician Assistant | Admitting: Physician Assistant

## 2023-02-02 DIAGNOSIS — R413 Other amnesia: Secondary | ICD-10-CM

## 2023-02-04 ENCOUNTER — Other Ambulatory Visit: Payer: Self-pay | Admitting: Physician Assistant

## 2023-02-04 MED ORDER — DONEPEZIL HCL 5 MG PO TABS: 5.0000 mg | ORAL_TABLET | Freq: Every day | ORAL | 11 refills | Status: DC

## 2023-02-04 NOTE — Progress Notes (Signed)
Please, inform patient that the MRI results show chronic aging changes in the vessels and mild age related atrophy. No stroke, masses, fluid or infection is seen. Thank you . Will start a low dose of memory pill Start donepezil 5 mg daily.

## 2023-02-06 ENCOUNTER — Ambulatory Visit: Payer: Medicare Other | Admitting: Psychiatry

## 2023-03-20 ENCOUNTER — Encounter: Payer: Self-pay | Admitting: Psychiatry

## 2023-03-20 ENCOUNTER — Ambulatory Visit (INDEPENDENT_AMBULATORY_CARE_PROVIDER_SITE_OTHER): Payer: Medicare Other | Admitting: Psychiatry

## 2023-03-20 DIAGNOSIS — F900 Attention-deficit hyperactivity disorder, predominantly inattentive type: Secondary | ICD-10-CM

## 2023-03-20 DIAGNOSIS — R413 Other amnesia: Secondary | ICD-10-CM

## 2023-03-20 DIAGNOSIS — F3342 Major depressive disorder, recurrent, in full remission: Secondary | ICD-10-CM

## 2023-03-20 MED ORDER — DONEPEZIL HCL 10 MG PO TABS
10.0000 mg | ORAL_TABLET | Freq: Every day | ORAL | 3 refills | Status: DC
Start: 2023-03-20 — End: 2023-10-13

## 2023-03-20 MED ORDER — BUPROPION HCL ER (XL) 300 MG PO TB24
300.0000 mg | ORAL_TABLET | ORAL | 1 refills | Status: DC
Start: 2023-03-20 — End: 2023-09-05

## 2023-03-20 MED ORDER — SERTRALINE HCL 100 MG PO TABS
100.0000 mg | ORAL_TABLET | Freq: Every day | ORAL | 1 refills | Status: DC
Start: 2023-03-20 — End: 2023-09-05

## 2023-03-20 NOTE — Progress Notes (Signed)
Taylor Oconnell [id number] 02-04-1956 67 y.o.  Subjective:   Patient ID:  Taylor Oconnell is a 67 y.o. (DOB 1956/07/05) female.  Chief Complaint:  Chief Complaint  Patient presents with   Follow-up   Depression   Anxiety    Depression        Associated symptoms include decreased concentration.  Associated symptoms include no suicidal ideas.     Taylor Oconnell presents to the office today for follow-up of ADD and depression.  seen Jan 2021. No med changes. On the following meds: Continue meds without changing. Adderall XR 75 daily Wellbutrin XL 300 AM Sertraline 25 daily  01/26/20 appt with the following noted: Retired 4 years.  Stays occupied.   Still doing well.  It helps that I'm a home-body re: Covid. Still handles isolation fine.  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   "I think I've been fine"  But family complains vaguely about things like she's slow.  Do get side-tracked and is chronically slow too.  Taylor Oconnell says she's different perhaps less attentive.  Patient doesn't notice much change.  She doesn't have a schedule and that contributes to lower productivity.  Compliant with meds.  Satisfied with meds.  Plan: Continue meds without changing. Adderall XR 75 daily Wellbutrin XL 300 AM Sertraline 25 daily Wellbutrin XL 300 AM  07/27/2020 appointment with the following noted:  More stress that [name]'s BF cheated and she moved back in.  She and Taylor Oconnell butt head leading to more stress.  Pt increased sertraline in the last 6 weeks DT this stress.  Then pt cried more and this is better controlled on 50 mg sertraline. She'll build a house behind them and so will be there for a year.   Plan: Continue meds without changing. Adderall XR 75 daily Wellbutrin XL 300 AM Sertraline 50 daily  As of  09/15/20 insurance no longer would cover any higher dose than Adderall XR 25 mg 2 daily.  It was suggested that she substitute one of the  Adderall XR with Adzenys 15.7 mg.  02/07/2021 appointment with the following noted: Hasn't tried Adzenys yet.  Several mos of health problems and couple of surgeries and Mo in law died in 12/20/2022 and never made it to [employer].  Does want to try it.  Misses Mo in Social worker.  Normal grief. Health under control. Bounces from one thing to another.  Which is better with higher dose Adderall.  Forgetful and distractible. Of note last med change was added sertraline 25 mg in February 2019 for excessive tearfulness.  It helped until recently as noted.  When Taylor Oconnell is hateful can cry.   Plan: Continue meds without changing. Adderall XR 50 daily plus Adzenys 15.7 daily Wellbutrin XL 300 AM Sertraline 50 daily  08/09/2021 appointment with the following noted: Stopped Adderall but still takes Adzenys prn.  Didn't seem like the Adderall made much difference but did have sleepiness for awhile which has worn off. Chronically sccattered but Adzenys works better than adderall Still a night owl. Goes to bed 2 and up at 1000.   No crying spells.  Taylor Oconnell can still hurt her feelings and not as good on Paxil as he was on sertraline. Patient reports stable mood and denies depressed or irritable moods.  Patient denies difficulty with sleep initiation or maintenance. Denies appetite disturbance.  Patient reports that energy and motivation have been good.  Patient denies any difficulty with concentration.  Patient denies any suicidal ideation. Taylor Oconnell's health is stable.  She still feels he needs to be on meds.  Get's irritable and angry to easily.  Plan: Excessive tearfulness helped more  with increase to 50 mg sertraline. Continue meds without changing. Stopped Adderall XR 50 daily  Takes prn Adzenys 15.7 daily bc it helps more than Adderall Wellbutrin XL 300 AM Sertraline 50 daily  02/07/2022 appointment with the following noted: Sometimes takes 2 sertraline over easy crying if Taylor Oconnell is rude to her or critical. Can happen  daily. Goes fishing with H and enjoys it.  Not depressed. Anxiety is pretty good but sometimes doesn't want to leave the house bc not interested in seeing people bc they don't have sense any more.  Not sure it's worry but thinks about a lot of things.  General things.Long term homebody. Usually ok to go out to eat with H. No SE Sleep always fine. Taylor Oconnell says she's slow and it bothers him.  But she's just not a hurry type person.  Not particularly. No sig checking. Taylor Oconnell better on his meds but not better enough.  03/20/23 appt noted: Some memory issues.  Dr. Nehemiah Oconnell did MMSE and recall at 2 min 0/3.  Started donepezil 5 mg about 4 mos ago.  Tad bit of benefit.   Saw neuro Dr. Vella Oconnell score 19 Mood about the same with occ down.  Anxiety is ok.   Sleep is good.  No sleep meds.    She's retired for 2017. Loves it..  Tendency to white coat syndrome.  PE and increased BP med and she has readings that are within the normal range.  Past Psychiatric Medication Trials: Wellbutrin XL 300, Adderall XR 75 daily, sertraline 50 .   Review of Systems:  Review of Systems  Cardiovascular:  Negative for palpitations.  Neurological:  Negative for tremors.  Psychiatric/Behavioral:  Positive for decreased concentration and depression. Negative for agitation, behavioral problems, confusion, dysphoric mood, hallucinations, self-injury, sleep disturbance and suicidal ideas. The patient is not nervous/anxious and is not hyperactive.   Depression under control.  Medications: I have reviewed the patient's current medications.  Current Outpatient Medications  Medication Sig Dispense Refill   Ascorbic Acid (VITAMIN C CR) 1000 MG TBCR Take by mouth.     famotidine (PEPCID) 40 MG tablet TAKE ONE TABLET BY MOUTH AT BEDTIME 30 tablet 5   hydrochlorothiazide (HYDRODIURIL) 25 MG tablet      ipratropium (ATROVENT) 0.03 % nasal spray Place 1-2 sprays in each nostril 15 minutes  (prior to meals) to help with the  drippy nose.Caution as this can be drying 30 mL 1   ipratropium (ATROVENT) 0.06 % nasal spray Can use two sprays in each nostril before eating as directed. 15 mL 5   loratadine (CLARITIN) 10 MG tablet Take 10 mg by mouth daily.     MYRBETRIQ 50 MG TB24 tablet Take 50 mg by mouth daily.     oxybutynin (DITROPAN XL) 15 MG 24 hr tablet Take 15 mg by mouth daily.     Probiotic Product (DIGESTIVE ADVANTAGE PO) Take by mouth daily.     ramipril (ALTACE) 10 MG capsule Take 10 mg by mouth daily.     buPROPion (WELLBUTRIN XL) 300 MG 24 hr tablet Take 1 tablet (300 mg total) by mouth every morning. 90 tablet 1   donepezil (ARICEPT) 10 MG tablet Take 1 tablet (10 mg total) by mouth daily. 90 tablet 3   sertraline (ZOLOFT) 100 MG tablet Take  1 tablet (100 mg total) by mouth daily. 90 tablet 1   No current facility-administered medications for this visit.    Medication Side Effects: None  Allergies:  Allergies  Allergen Reactions   Sulfamethoxazole Swelling    Past Medical History:  Diagnosis Date   High blood pressure     Family History  Problem Relation Age of Onset   High blood pressure Mother    Heart Problems Father    Bipolar disorder Paternal Grandmother     Social History   Socioeconomic History   Marital status: Married    Spouse name: Not on file   Number of children: 2   Years of education: 16   Highest education level: Not on file  Occupational History   Not on file  Tobacco Use   Smoking status: Never   Smokeless tobacco: Never  Substance and Sexual Activity   Alcohol use: Not Currently   Drug use: Never   Sexual activity: Yes    Partners: Male    Birth control/protection: None  Other Topics Concern   Not on file  Social History Narrative   Right handed    One story home   Lives with husband   Retired   Occasionally tea, husband is Engineer, agricultural   Social Determinants of Health   Financial Resource Strain: Low Risk  (09/25/2022)   Received from [facility],  [facility]   Overall Financial Resource Strain (CARDIA)    Difficulty of Paying Living Expenses: Not hard at all  Food Insecurity: No Food Insecurity (09/25/2022)   Received from [facility], [facility]   Hunger Vital Sign    Worried About Running Out of Food in the Last Year: Never true    Ran Out of Food in the Last Year: Never true  Transportation Needs: No Transportation Needs (09/25/2022)   Received from [facility], [facility]   PRAPARE - Transportation    Lack of Transportation (Medical): No    Lack of Transportation (Non-Medical): No  Physical Activity: Not on file  Stress: No Stress Concern Present (10/24/2020)   Received from [facility], [facility] - Occupational Stress Questionnaire    Feeling of Stress : Only a little  Social Connections: Unknown (11/19/2021)   Received from [facility], [facility]   Social Network    Social Network: Not on file  Intimate Partner Violence: Unknown (10/11/2021)   Received from [facility], [facility]   HITS    Physically Hurt: Not on file    Insult or Talk Down To: Not on file    Threaten Physical Harm: Not on file    Scream or Curse: Not on file    Past Medical History, Surgical history, Social history, and Family history were reviewed and updated as appropriate.   Please see review of systems for further details on the patient's review from today.   Objective:   Physical Exam:  There were no vitals taken for this visit.  Physical Exam Constitutional:      General: She is not in acute distress.    Appearance: She is well-developed.  Musculoskeletal:        General: No deformity.  Neurological:     Mental Status: She is alert and oriented to person, place, and time.     Motor: No tremor.     Coordination: Coordination normal.     Gait: Gait normal.  Psychiatric:        Attention  and Perception: Attention and perception normal.        Mood and Affect: Mood  is not anxious or depressed. Affect is not labile, blunt or inappropriate.        Speech: Speech normal.        Behavior: Behavior normal.        Thought Content: Thought content normal. Thought content is not delusional. Thought content does not include homicidal or suicidal ideation. Thought content does not include suicidal plan.        Cognition and Memory: Cognition normal. She exhibits impaired recent memory.        Judgment: Judgment normal.     Comments: Insight intact. No auditory or visual hallucinations. No delusions.       Lab Review:     Component Value Date/Time   NA 140 04/05/2022 1344   K 3.8 04/05/2022 1344   CL 100 04/05/2022 1344   CO2 27 04/05/2022 1344   GLUCOSE 107 (H) 04/05/2022 1344   BUN 14 04/05/2022 1344   CREATININE 0.77 04/05/2022 1344   CALCIUM 9.6 04/05/2022 1344   PROT 6.7 04/05/2022 1344   ALBUMIN 4.2 04/05/2022 1344   AST 15 04/05/2022 1344   ALT 16 04/05/2022 1344   ALKPHOS 110 04/05/2022 1344   BILITOT 0.3 04/05/2022 1344       Component Value Date/Time   WBC 5.6 04/05/2022 1344   RBC 5.03 04/05/2022 1344   HGB 14.3 04/05/2022 1344   HCT 43.9 04/05/2022 1344   MCV 87 04/05/2022 1344   MCH 28.4 04/05/2022 1344   MCHC 32.6 04/05/2022 1344   RDW 13.2 04/05/2022 1344   LYMPHSABS 1.4 04/05/2022 1344   EOSABS 0.3 04/05/2022 1344   BASOSABS 0.1 04/05/2022 1344    No results found for: "POCLITH", "LITHIUM"   No results found for: "PHENYTOIN", "PHENOBARB", "VALPROATE", "CBMZ"   .res Assessment: Plan:    Tonjia was seen today for follow-up, depression and anxiety.  Diagnoses and all orders for this visit:  Recurrent major depression in full remission (HCC) -     buPROPion (WELLBUTRIN XL) 300 MG 24 hr tablet; Take 1 tablet (300 mg total) by mouth every morning. -     sertraline (ZOLOFT) 100 MG tablet; Take 1 tablet (100 mg total) by mouth daily.  Attention deficit hyperactivity disorder (ADHD), predominantly inattentive type -      buPROPion (WELLBUTRIN XL) 300 MG 24 hr tablet; Take 1 tablet (300 mg total) by mouth every morning.  Memory impairment -     donepezil (ARICEPT) 10 MG tablet; Take 1 tablet (10 mg total) by mouth daily.   Mood and anxiety pretty stable except excessive tearfulness.  Chronic ADD only marginally controlled but on max meds.  And is retired now so less demands.  hearing aids for mental health reasons.  It's helped.  Excessive tearfulness returned so increase to 100 mg sertraline. Call if any problems with increasing it.  Disc SE  Wellbutrin XL 300 AM  Increase B12 dosage bc level low normal on supplement for memory Increase donepezil to 10 mg daily.  FU 6 mos  Meredith Staggers, MD, DFAPA  Please see After Visit Summary for patient specific instructions.  Future Appointments  Date Time Provider Department Center  03/28/2023  3:30 PM Elwyn Reach [facility] None  10/29/2023  1:00 PM Rosann Auerbach, PhD [facility] None  10/29/2023  2:00 PM Alan Mulder TECH [facility] None  11/05/2023  2:30 PM Rosann Auerbach, PhD [facility] None  No orders of the defined types were placed in this encounter.      -------------------------------

## 2023-03-20 NOTE — Patient Instructions (Signed)
Increase B12 dosage bc level low normal on supplement Increase donepezil to 10 mg daily.

## 2023-03-25 ENCOUNTER — Other Ambulatory Visit: Payer: Self-pay | Admitting: Psychiatry

## 2023-03-28 ENCOUNTER — Ambulatory Visit (INDEPENDENT_AMBULATORY_CARE_PROVIDER_SITE_OTHER): Payer: Medicare Other | Admitting: Physician Assistant

## 2023-03-28 ENCOUNTER — Encounter: Payer: Self-pay | Admitting: Physician Assistant

## 2023-03-28 VITALS — BP 161/74 | HR 77 | Resp 18 | Ht 62.0 in | Wt 237.0 lb

## 2023-03-28 DIAGNOSIS — R413 Other amnesia: Secondary | ICD-10-CM | POA: Diagnosis not present

## 2023-03-28 NOTE — Progress Notes (Signed)
Assessment/Plan:   Mild Cognitive Impairment of unclear etiology  Taylor Oconnell is a very pleasant 67 y.o. RH female with a history of papilloma of the left breast, bilateral hearing loss, ADHD, depression followed by psychiatry, OSA on CPAP seen today in follow up to discuss the MRI of the brain results on 02/02/2023. These were personally reviewed, remarkable for moderate chronic small vessel ischemic changes within the cerebral white matter and pons, and mild generalized cerebral atrophy.  last MoCA was 19/30 on 01/02/2023. Patient is currently on donepezil 10 mg daily by PCP. This patient is accompanied in the office by her husband who supplements the history.  Previous records as well as any outside records available were reviewed prior to todays visit.     Follow up in 6  months. Continue donepezil 10 mg daily side effects discussed Patient is scheduled for neuropsych evaluation in the near future to further evaluate cognitive concerns and determine other underlying causes of memory changes including potential contribution from sleep, anxiety or depression. Continue CPAP for OSA Recommend increasing physical activity and engaging in social activities Continue to control mood, ADHD as per PCP and psychiatry, she is on Zoloft, Wellbutrin Recommend good control of cardiovascular risk factors    CURRENT MEDICATIONS:  Outpatient Encounter Medications as of 03/28/2023  Medication Sig   Ascorbic Acid (VITAMIN C CR) 1000 MG TBCR Take by mouth.   buPROPion (WELLBUTRIN XL) 300 MG 24 hr tablet Take 1 tablet (300 mg total) by mouth every morning.   donepezil (ARICEPT) 10 MG tablet Take 1 tablet (10 mg total) by mouth daily.   famotidine (PEPCID) 40 MG tablet TAKE ONE TABLET BY MOUTH AT BEDTIME   hydrochlorothiazide (HYDRODIURIL) 25 MG tablet    ipratropium (ATROVENT) 0.03 % nasal spray Place 1-2 sprays in each nostril 15 minutes  (prior to meals) to help with the drippy nose.Caution as this  can be drying   ipratropium (ATROVENT) 0.06 % nasal spray Can use two sprays in each nostril before eating as directed.   loratadine (CLARITIN) 10 MG tablet Take 10 mg by mouth daily.   MYRBETRIQ 50 MG TB24 tablet Take 50 mg by mouth daily.   oxybutynin (DITROPAN XL) 15 MG 24 hr tablet Take 15 mg by mouth daily.   Probiotic Product (DIGESTIVE ADVANTAGE PO) Take by mouth daily.   ramipril (ALTACE) 10 MG capsule Take 10 mg by mouth daily.   sertraline (ZOLOFT) 100 MG tablet Take 1 tablet (100 mg total) by mouth daily.   No facility-administered encounter medications on file as of 03/28/2023.        No data to display            01/02/2023    1:00 PM  Montreal Cognitive Assessment   Visuospatial/ Executive (0/5) 4  Naming (0/3) 3  Attention: Read list of digits (0/2) 2  Attention: Read list of letters (0/1) 1  Attention: Serial 7 subtraction starting at 100 (0/3) 1  Language: Repeat phrase (0/2) 1  Language : Fluency (0/1) 1  Abstraction (0/2) 2  Delayed Recall (0/5) 0  Orientation (0/6) 4  Total 19  Adjusted Score (based on education) 19   Thank you for allowing [place] the opportunity to participate in the care of this nice patient. Please do not hesitate to contact us for any questions or concerns.   Total time spent on today's visit was 36 minutes dedicated to this patient today, preparing to see patient, examining the patient, ordering tests  and/or medications and counseling the patient, documenting clinical information in the EHR or other health record, independently interpreting results and communicating results to the patient/family, discussing treatment and goals, answering patient's questions and coordinating care.  Cc:  Renford Dills, MD  Marlowe Kays 03/28/2023 7:01 AM

## 2023-03-28 NOTE — Patient Instructions (Addendum)
Neurocognitive testing to further evaluate cognitive concerns and determine other underlying cause of memory changes, including potential contribution from sleep, anxiety, or depression   Take B12 daily  Continue aricept 10 mg daily Continue taking CPAP Continue mood control with psychiatry Follow in 6 months  Increase exercise Follow up in 6 month   To help prevent  worsening memory  1.  Proper sleep (8 hours a night) 2.  Routine Exercise (at least 10,000 steps per day) 3.  Socialize.  Interact with others.  Meet friends for lunch.  Visit family. 4.  Learn new things:  Read a book about a topic that interests you, watch a documentary, read the newspaper, talk to friends about different topics. 5.  Mediterranean diet  Mediterranean Diet A Mediterranean diet refers to food and lifestyle choices that are based on the traditions of countries located on the [employer]. This way of eating has been shown to help prevent certain conditions and improve outcomes for people who have chronic diseases, like kidney disease and heart disease. What are tips for following this plan? Lifestyle  Cook and eat meals together with your family, when possible. Drink enough fluid to keep your urine clear or pale yellow. Be physically active every day. This includes: Aerobic exercise like running or swimming. Leisure activities like gardening, walking, or housework. Get 7-8 hours of sleep each night. If recommended by your health care provider, drink red wine in moderation. This means 1 glass a day for nonpregnant women and 2 glasses a day for men. A glass of wine equals 5 oz (150 mL). Reading food labels  Check the serving size of packaged foods. For foods such as rice and pasta, the serving size refers to the amount of cooked product, not dry. Check the total fat in packaged foods. Avoid foods that have saturated fat or trans fats. Check the ingredients list for added sugars, such as corn  syrup. Shopping  At the grocery store, buy most of your food from the areas near the walls of the store. This includes: Fresh fruits and vegetables (produce). Grains, beans, nuts, and seeds. Some of these may be available in unpackaged forms or large amounts (in bulk). Fresh seafood. Poultry and eggs. Low-fat dairy products. Buy whole ingredients instead of prepackaged foods. Buy fresh fruits and vegetables in-season from local farmers markets. Buy frozen fruits and vegetables in resealable bags. If you do not have access to quality fresh seafood, buy precooked frozen shrimp or canned fish, such as tuna, salmon, or sardines. Buy small amounts of raw or cooked vegetables, salads, or olives from the deli or salad bar at your store. Stock your pantry so you always have certain foods on hand, such as olive oil, canned tuna, canned tomatoes, rice, pasta, and beans. Cooking  Cook foods with extra-virgin olive oil instead of using butter or other vegetable oils. Have meat as a side dish, and have vegetables or grains as your main dish. This means having meat in small portions or adding small amounts of meat to foods like pasta or stew. Use beans or vegetables instead of meat in common dishes like chili or lasagna. Experiment with different cooking methods. Try roasting or broiling vegetables instead of steaming or sauteing them. Add frozen vegetables to soups, stews, pasta, or rice. Add nuts or seeds for added healthy fat at each meal. You can add these to yogurt, salads, or vegetable dishes. Marinate fish or vegetables using olive oil, lemon juice, garlic, and fresh herbs. Meal  planning  Plan to eat 1 vegetarian meal one day each week. Try to work up to 2 vegetarian meals, if possible. Eat seafood 2 or more times a week. Have healthy snacks readily available, such as: Vegetable sticks with hummus. Greek yogurt. Fruit and nut trail mix. Eat balanced meals throughout the week. This  includes: Fruit: 2-3 servings a day Vegetables: 4-5 servings a day Low-fat dairy: 2 servings a day Fish, poultry, or lean meat: 1 serving a day Beans and legumes: 2 or more servings a week Nuts and seeds: 1-2 servings a day Whole grains: 6-8 servings a day Extra-virgin olive oil: 3-4 servings a day Limit red meat and sweets to only a few servings a month What are my food choices? Mediterranean diet Recommended Grains: Whole-grain pasta. Brown rice. Bulgar wheat. Polenta. Couscous. Whole-wheat bread. Orpah Cobb. Vegetables: Artichokes. Beets. Broccoli. Cabbage. Carrots. Eggplant. Green beans. Chard. Kale. Spinach. Onions. Leeks. Peas. Squash. Tomatoes. Peppers. Radishes. Fruits: Apples. Apricots. Avocado. Berries. Bananas. Cherries. Dates. Figs. Grapes. Lemons. Melon. Oranges. Peaches. Plums. Pomegranate. Meats and other protein foods: Beans. Almonds. Sunflower seeds. Pine nuts. Peanuts. Cod. Salmon. Scallops. Shrimp. Tuna. Tilapia. Clams. Oysters. Eggs. Dairy: Low-fat milk. Cheese. Greek yogurt. Beverages: Water. Red wine. Herbal tea. Fats and oils: Extra virgin olive oil. Avocado oil. Grape seed oil. Sweets and desserts: [place] yogurt with honey. Baked apples. Poached pears. Trail mix. Seasoning and other foods: Basil. Cilantro. Coriander. Cumin. Mint. Parsley. Sage. Rosemary. Tarragon. Garlic. Oregano. Thyme. Pepper. Balsalmic vinegar. Tahini. Hummus. Tomato sauce. Olives. Mushrooms. Limit these Grains: Prepackaged pasta or rice dishes. Prepackaged cereal with added sugar. Vegetables: Deep fried potatoes (french fries). Fruits: Fruit canned in syrup. Meats and other protein foods: Beef. Pork. Lamb. Poultry with skin. Hot dogs. Tomasa Blase. Dairy: Ice cream. Sour cream. Whole milk. Beverages: Juice. Sugar-sweetened soft drinks. Beer. Liquor and spirits. Fats and oils: Butter. Canola oil. Vegetable oil. Beef fat (tallow). Lard. Sweets and desserts: Cookies. Cakes. Pies. Candy. Seasoning  and other foods: Mayonnaise. Premade sauces and marinades. The items listed may not be a complete list. Talk with your dietitian about what dietary choices are right for you. Summary The Mediterranean diet includes both food and lifestyle choices. Eat a variety of fresh fruits and vegetables, beans, nuts, seeds, and whole grains. Limit the amount of red meat and sweets that you eat. Talk with your health care provider about whether it is safe for you to drink red wine in moderation. This means 1 glass a day for nonpregnant women and 2 glasses a day for men. A glass of wine equals 5 oz (150 mL). This information is not intended to replace advice given to you by your health care provider. Make sure you discuss any questions you have with your health care provider. Document Released: 02/15/2016 Document Revised: 03/19/2016 Document Reviewed: 02/15/2016 Elsevier Interactive Patient Education  2017 [employer].

## 2023-04-30 ENCOUNTER — Other Ambulatory Visit: Payer: Self-pay | Admitting: Allergy and Immunology

## 2023-06-17 ENCOUNTER — Ambulatory Visit: Payer: Medicare Other | Admitting: Physician Assistant

## 2023-07-28 ENCOUNTER — Encounter: Payer: Self-pay | Admitting: Physician Assistant

## 2023-09-04 ENCOUNTER — Other Ambulatory Visit: Payer: Self-pay | Admitting: Psychiatry

## 2023-09-04 DIAGNOSIS — F3342 Major depressive disorder, recurrent, in full remission: Secondary | ICD-10-CM

## 2023-09-04 DIAGNOSIS — F900 Attention-deficit hyperactivity disorder, predominantly inattentive type: Secondary | ICD-10-CM

## 2023-09-13 ENCOUNTER — Other Ambulatory Visit: Payer: Self-pay | Admitting: Psychiatry

## 2023-09-13 DIAGNOSIS — F900 Attention-deficit hyperactivity disorder, predominantly inattentive type: Secondary | ICD-10-CM

## 2023-09-13 DIAGNOSIS — F3342 Major depressive disorder, recurrent, in full remission: Secondary | ICD-10-CM

## 2023-09-17 ENCOUNTER — Ambulatory Visit: Payer: Medicare Other | Admitting: Psychiatry

## 2023-09-29 ENCOUNTER — Ambulatory Visit: Payer: Medicare Other | Admitting: Physician Assistant

## 2023-10-02 ENCOUNTER — Ambulatory Visit (INDEPENDENT_AMBULATORY_CARE_PROVIDER_SITE_OTHER): Payer: Medicare Other | Admitting: Physician Assistant

## 2023-10-02 ENCOUNTER — Encounter: Payer: Self-pay | Admitting: Physician Assistant

## 2023-10-02 VITALS — BP 133/68 | HR 88 | Resp 20 | Ht 62.0 in | Wt 223.0 lb

## 2023-10-02 DIAGNOSIS — R413 Other amnesia: Secondary | ICD-10-CM | POA: Diagnosis not present

## 2023-10-02 NOTE — Patient Instructions (Signed)
 Neurocognitive testing to further evaluate cognitive concerns and determine other underlying cause of memory changes, including potential contribution from sleep, anxiety, or depression   Take B12 daily  Continue aricept 10 mg daily Continue taking CPAP Continue mood control with psychiatry Follow in 6 months  Increase exercise    To help prevent  worsening memory  1.  Proper sleep (8 hours a night) 2.  Routine Exercise (at least 10,000 steps per day) 3.  Socialize.  Interact with others.  Meet friends for lunch.  Visit family. 4.  Learn new things:  Read a book about a topic that interests you, watch a documentary, read the newspaper, talk to friends about different topics. 5.  Mediterranean diet  Mediterranean Diet A Mediterranean diet refers to food and lifestyle choices that are based on the traditions of countries located on the [employer]. This way of eating has been shown to help prevent certain conditions and improve outcomes for people who have chronic diseases, like kidney disease and heart disease. What are tips for following this plan? Lifestyle  Cook and eat meals together with your family, when possible. Drink enough fluid to keep your urine clear or pale yellow. Be physically active every day. This includes: Aerobic exercise like running or swimming. Leisure activities like gardening, walking, or housework. Get 7-8 hours of sleep each night. If recommended by your health care provider, drink red wine in moderation. This means 1 glass a day for nonpregnant women and 2 glasses a day for men. A glass of wine equals 5 oz (150 mL). Reading food labels  Check the serving size of packaged foods. For foods such as rice and pasta, the serving size refers to the amount of cooked product, not dry. Check the total fat in packaged foods. Avoid foods that have saturated fat or trans fats. Check the ingredients list for added sugars, such as corn syrup. Shopping  At the  grocery store, buy most of your food from the areas near the walls of the store. This includes: Fresh fruits and vegetables (produce). Grains, beans, nuts, and seeds. Some of these may be available in unpackaged forms or large amounts (in bulk). Fresh seafood. Poultry and eggs. Low-fat dairy products. Buy whole ingredients instead of prepackaged foods. Buy fresh fruits and vegetables in-season from local farmers markets. Buy frozen fruits and vegetables in resealable bags. If you do not have access to quality fresh seafood, buy precooked frozen shrimp or canned fish, such as tuna, salmon, or sardines. Buy small amounts of raw or cooked vegetables, salads, or olives from the deli or salad bar at your store. Stock your pantry so you always have certain foods on hand, such as olive oil, canned tuna, canned tomatoes, rice, pasta, and beans. Cooking  Cook foods with extra-virgin olive oil instead of using butter or other vegetable oils. Have meat as a side dish, and have vegetables or grains as your main dish. This means having meat in small portions or adding small amounts of meat to foods like pasta or stew. Use beans or vegetables instead of meat in common dishes like chili or lasagna. Experiment with different cooking methods. Try roasting or broiling vegetables instead of steaming or sauteing them. Add frozen vegetables to soups, stews, pasta, or rice. Add nuts or seeds for added healthy fat at each meal. You can add these to yogurt, salads, or vegetable dishes. Marinate fish or vegetables using olive oil, lemon juice, garlic, and fresh herbs. Meal planning  Plan to  eat 1 vegetarian meal one day each week. Try to work up to 2 vegetarian meals, if possible. Eat seafood 2 or more times a week. Have healthy snacks readily available, such as: Vegetable sticks with hummus. Greek yogurt. Fruit and nut trail mix. Eat balanced meals throughout the week. This includes: Fruit: 2-3 servings a  day Vegetables: 4-5 servings a day Low-fat dairy: 2 servings a day Fish, poultry, or lean meat: 1 serving a day Beans and legumes: 2 or more servings a week Nuts and seeds: 1-2 servings a day Whole grains: 6-8 servings a day Extra-virgin olive oil: 3-4 servings a day Limit red meat and sweets to only a few servings a month What are my food choices? Mediterranean diet Recommended Grains: Whole-grain pasta. Brown rice. Bulgar wheat. Polenta. Couscous. Whole-wheat bread. Orpah Cobb. Vegetables: Artichokes. Beets. Broccoli. Cabbage. Carrots. Eggplant. Green beans. Chard. Kale. Spinach. Onions. Leeks. Peas. Squash. Tomatoes. Peppers. Radishes. Fruits: Apples. Apricots. Avocado. Berries. Bananas. Cherries. Dates. Figs. Grapes. Lemons. Melon. Oranges. Peaches. Plums. Pomegranate. Meats and other protein foods: Beans. Almonds. Sunflower seeds. Pine nuts. Peanuts. Cod. Salmon. Scallops. Shrimp. Tuna. Tilapia. Clams. Oysters. Eggs. Dairy: Low-fat milk. Cheese. Greek yogurt. Beverages: Water. Red wine. Herbal tea. Fats and oils: Extra virgin olive oil. Avocado oil. Grape seed oil. Sweets and desserts: [place] yogurt with honey. Baked apples. Poached pears. Trail mix. Seasoning and other foods: Basil. Cilantro. Coriander. Cumin. Mint. Parsley. Sage. Rosemary. Tarragon. Garlic. Oregano. Thyme. Pepper. Balsalmic vinegar. Tahini. Hummus. Tomato sauce. Olives. Mushrooms. Limit these Grains: Prepackaged pasta or rice dishes. Prepackaged cereal with added sugar. Vegetables: Deep fried potatoes (french fries). Fruits: Fruit canned in syrup. Meats and other protein foods: Beef. Pork. Lamb. Poultry with skin. Hot dogs. Tomasa Blase. Dairy: Ice cream. Sour cream. Whole milk. Beverages: Juice. Sugar-sweetened soft drinks. Beer. Liquor and spirits. Fats and oils: Butter. Canola oil. Vegetable oil. Beef fat (tallow). Lard. Sweets and desserts: Cookies. Cakes. Pies. Candy. Seasoning and other foods: Mayonnaise.  Premade sauces and marinades. The items listed may not be a complete list. Talk with your dietitian about what dietary choices are right for you. Summary The Mediterranean diet includes both food and lifestyle choices. Eat a variety of fresh fruits and vegetables, beans, nuts, seeds, and whole grains. Limit the amount of red meat and sweets that you eat. Talk with your health care provider about whether it is safe for you to drink red wine in moderation. This means 1 glass a day for nonpregnant women and 2 glasses a day for men. A glass of wine equals 5 oz (150 mL). This information is not intended to replace advice given to you by your health care provider. Make sure you discuss any questions you have with your health care provider. Document Released: 02/15/2016 Document Revised: 03/19/2016 Document Reviewed: 02/15/2016 Elsevier Interactive Patient Education  2017 [employer].

## 2023-10-02 NOTE — Progress Notes (Signed)
 Assessment/Plan:   Memory Impairment   Taylor Oconnell is a very pleasant 68 y.o. RH female with a history of papilloma of the left breast, bilateral hearing loss, ADHD, depression followed by psychiatry, OSA on CPAP  presenting today in follow-up for evaluation of memory loss. Patient is on donepezil 10 mg daily by PCP . MMSE today is 28/30. She sees psychiatry for mood, stable.  Patient is able to participate on ADLs and to drive without difficulties       Recommendations:   Follow up in  6 months. Patient is scheduled for Neuropsych testing on 10/2023 for clarity of diagnosis and disease trajectory   Continue donepezil 10 mg daily by PCP. Side effects were discussed  Recommend using CPAP for OSA  Continue B12 supplements Recommend good control of cardiovascular risk factors Continue to control mood, ADHD as per PCPand psychiatry    Subjective:   This patient is accompanied in the office by her husband  who supplements the history. Previous records as well as any outside records available were reviewed prior to todays visit.   Patient was last seen on 03/2023 with [facility] 19/30     Any changes in memory since last visit? "About the same"-she says. Patient has some difficulty remembering recent conversations and names of people. She may forget about appointments.  Likes to watch game shows, reading, crafting.  repeats oneself? "A little, once in a while" Disoriented when walking into a room?  Patient denies, but if she goes to a large place she may feel uncomfortable, "a little lost".     Misplacing objects?  Patient denies   Wandering behavior?   denies   Any personality changes since last visit?   denies   Any worsening depression?: She has a history of depression, cries easily, follows with psychiatry.   Hallucinations or paranoia?  Denies.   Seizures?   Denies.    Any sleep changes? Sleeps well. Denies vivid dreams, REM behavior or sleepwalking. She goes to sleep late, then may  sleep in the afternoon but "I have done a little better lately, now going between 1-2 am".   Sleep apnea? Endorsed, uses CPAP.   Any hygiene concerns?   denies   Independent of bathing and dressing?  Endorsed  Does the patient needs help with medications? Patient is in charge   Who is in charge of the finances?  Patient is in charge     Any changes in appetite? Decreased, as before, especially since her gallbladder surgery   Patient have trouble swallowing?  denies   Does the patient cook?  Denies.  Any kitchen accidents such as leaving the stove on?   denies   Any headaches?    Denies.   Vision changes? Denies. Chronic pain?  Denies. Takes less pain meds.    Ambulates with difficulty? Denies , but does not participate in exercise programs, walking more.  Recent falls or head injuries?  Denies.      Unilateral weakness, numbness or tingling?   Denies.   Any tremors?  Denies.   Any anosmia?    denies   Any incontinence of urine? Stress incontinence, uses Depends .  Any bowel dysfunction?  Denies.      Patient lives with her husband    Does the patient drive?yes, denies getting lost   MRI of the brain from 02/02/2023, personally reviewed, remarkable for moderate chronic small vessel ischemic changes within the cerebral white matter and pons, and mild generalized cerebral  atrophy.     Initial visit 12/2022 How long did patient have memory difficulties?  "Not that long but has not been normal for me, maybe less than 1 year ".  "I used to keep up with things before ". Patient has some difficulty remembering recent conversations and people names that she knows. Likes to watch game shows, reading, "I have to always to be doing something". She enjoys doing crafts.  repeats oneself?  Endorsed, especially with her husband Disoriented when walking into a room.  Patient denies, however if she goes to the supermarket, she "may feel a little lost there " leaving objects in unusual places? denies    Wandering behavior?  denies   Any personality changes?  Patient denies   Any history of depression?:  Endorsed, followed by psychiatry. She cries frequently.  Of note, she has not been up to taking all of her mood medications as prescribed by her physician.  She has another appointment soon, and she is going to discuss resuming them. Hallucinations or paranoia?  Patient denies   Seizures?   Patient denies    Any sleep changes?   Sleeps well, denies vivid dreams, REM behavior or sleepwalking of note, the patient goes to sleep at around 3 or 4 in the morning, and then sleeps afternoon.  She is in the process of  trying to sleep a little bit earlier, so that she can wake up earlier "-husband says Sleep apnea?  Endorsed, uses CPAP Any hygiene concerns?  Patient denies   Independent of bathing and dressing?  Endorsed  Does the patient needs help with medications? Patient is in charge   Who is in charge of the finances? Patient is in charge     Any changes in appetite?  denies , she does not like to eat meat as much.     Patient have trouble swallowing? denies   Does the patient cook? No    Any kitchen accidents such as leaving the stove on? denies   Any headaches? denies   Chronic back pain ? denies   Ambulates with difficulty?  denies, but does not participate in any exercise program.  Is not very active Recent falls or head injuries? denies   Vision changes? denies   Unilateral weakness, numbness or tingling? denies   Any tremors?   denies   Any anosmia?  denies   Any incontinence of urine? Endorsed. Wears pads.  Any bowel dysfunction? denies      Patient lives with  husband History of heavy alcohol intake? denies   History of heavy tobacco use? denies   Family history of dementia? 3 maternal  aunts and mother had AD Does patient drive? 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  No pertinent surgical history.   PREVIOUS MEDICATIONS:   CURRENT MEDICATIONS:  Outpatient Encounter Medications as of 10/02/2023  Medication Sig   Ascorbic Acid (VITAMIN C CR) 1000 MG TBCR Take by mouth.   buPROPion (WELLBUTRIN XL) 300 MG 24 hr tablet Take 1 tablet by mouth every morning.   donepezil (ARICEPT) 10 MG tablet Take 1 tablet (10 mg total) by mouth daily.   famotidine (PEPCID) 40 MG tablet TAKE ONE TABLET BY MOUTH AT BEDTIME   hydrochlorothiazide (HYDRODIURIL) 25 MG tablet    ipratropium (ATROVENT) 0.03 % nasal spray Place 1-2 sprays in each nostril 15 minutes  (prior to  meals) to help with the drippy nose.Caution as this can be drying   ipratropium (ATROVENT) 0.06 % nasal spray Can use two sprays in each nostril before eating as directed.   loratadine (CLARITIN) 10 MG tablet Take 10 mg by mouth daily.   MYRBETRIQ 50 MG TB24 tablet Take 50 mg by mouth daily.   oxybutynin (DITROPAN XL) 15 MG 24 hr tablet Take 15 mg by mouth daily.   Probiotic Product (DIGESTIVE ADVANTAGE PO) Take by mouth daily.   ramipril (ALTACE) 10 MG capsule Take 10 mg by mouth daily.   sertraline (ZOLOFT) 100 MG tablet Take 1 tablet by mouth daily.   No facility-administered encounter medications on file as of 10/02/2023.     Objective:     PHYSICAL EXAMINATION:    VITALS:   Vitals:   10/02/23 1452  BP: 133/68  Pulse: 88  Resp: 20  SpO2: 96%  Weight: 223 lb (101.2 kg)  Height: 5\' 2"  (1.575 m)    GEN:  The patient appears stated age and is in NAD. HEENT:  Normocephalic, atraumatic.   Neurological examination:  General: NAD, well-groomed, appears stated age. Orientation: The patient is alert. Oriented to person, place and date Cranial nerves: There is good facial symmetry.The speech is fluent and clear. No aphasia or dysarthria. Fund of knowledge is appropriate. Recent memory impaired and remote memory is normal.  Attention and concentration are normal.  Able to name objects and repeat  phrases.  Hearing is intact to conversational tone.   Delayed recall 2/3 Sensation: Sensation is intact to light touch throughout Motor: Strength is at least antigravity x4. DTR's 2/4 in UE/LE      01/02/2023    1:00 PM  Montreal Cognitive Assessment   Visuospatial/ Executive (0/5) 4  Naming (0/3) 3  Attention: Read list of digits (0/2) 2  Attention: Read list of letters (0/1) 1  Attention: Serial 7 subtraction starting at 100 (0/3) 1  Language: Repeat phrase (0/2) 1  Language : Fluency (0/1) 1  Abstraction (0/2) 2  Delayed Recall (0/5) 0  Orientation (0/6) 4  Total 19  Adjusted Score (based on education) 19       10/02/2023    3:00 PM  MMSE - Mini Mental State Exam  Orientation to time 4  Orientation to Place 5  Registration 3  Attention/ Calculation 5  Recall 2  Language- name 2 objects 2  Language- repeat 1  Language- follow 3 step command 3  Language- read & follow direction 1  Write a sentence 1  Copy design 1  Total score 28       Movement examination: Tone: There is normal tone in the UE/LE Abnormal movements:  no tremor.  No myoclonus.  No asterixis.   Coordination:  There is no decremation with RAM's. Normal finger to nose  Gait and Station: The patient has no difficulty arising out of a deep-seated chair without the use of the hands. The patient's stride length is good.  Gait is cautious and narrow.   Thank you for allowing [place] the opportunity to participate in the care of this nice patient. Please do not hesitate to contact us for any questions or concerns.   Total time spent on today's visit was 20 minutes dedicated to this patient today, preparing to see patient, examining the patient, ordering tests and/or medications and counseling the patient, documenting clinical information in the EHR or other health record, independently interpreting results and communicating results to the patient/family, discussing treatment  and goals, answering patient's questions  and coordinating care.  Cc:  Renford Dills, MD  Marlowe Kays 10/02/2023 3:41 PM

## 2023-10-13 ENCOUNTER — Ambulatory Visit: Admitting: Psychiatry

## 2023-10-13 ENCOUNTER — Encounter: Payer: Self-pay | Admitting: Psychiatry

## 2023-10-13 ENCOUNTER — Other Ambulatory Visit: Payer: Self-pay | Admitting: Psychiatry

## 2023-10-13 DIAGNOSIS — G4733 Obstructive sleep apnea (adult) (pediatric): Secondary | ICD-10-CM | POA: Diagnosis not present

## 2023-10-13 DIAGNOSIS — F900 Attention-deficit hyperactivity disorder, predominantly inattentive type: Secondary | ICD-10-CM | POA: Diagnosis not present

## 2023-10-13 DIAGNOSIS — F3342 Major depressive disorder, recurrent, in full remission: Secondary | ICD-10-CM | POA: Diagnosis not present

## 2023-10-13 DIAGNOSIS — R413 Other amnesia: Secondary | ICD-10-CM

## 2023-10-13 MED ORDER — BUPROPION HCL ER (XL) 300 MG PO TB24: 300.0000 mg | ORAL_TABLET | Freq: Every morning | ORAL | 3 refills | Status: AC

## 2023-10-13 MED ORDER — DONEPEZIL HCL 10 MG PO TABS: 10.0000 mg | ORAL_TABLET | Freq: Every day | ORAL | 3 refills | Status: AC

## 2023-10-13 MED ORDER — SERTRALINE HCL 100 MG PO TABS: 100.0000 mg | ORAL_TABLET | Freq: Every day | ORAL | 3 refills | Status: AC

## 2023-10-13 NOTE — Progress Notes (Signed)
 Taylor Oconnell [id number] Mar 17, 1956 68 y.o.  Subjective:   Patient ID:  Keerat Denicola Calamia is a 68 y.o. (DOB [birth year]) female.  Chief Complaint:  Chief Complaint  Patient presents with   Follow-up   Depression   ADD   Memory Loss    Taylor Oconnell presents to the office today for follow-up of ADD and depression.  seen Jan 2021. No med changes. On the following meds: Continue meds without changing. Adderall XR 75 daily Wellbutrin XL 300 AM Sertraline 25 daily  01/26/20 appt with the following noted: Retired 4 years.  Stays occupied.   Still doing well.  It helps that I'm a home-body re: Covid. Still handles isolation fine.  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   "I think I've been fine"  But family complains vaguely about things like she's slow.  Do get side-tracked and is chronically slow too.  [name] says she's different perhaps less attentive.  Patient doesn't notice much change.  She doesn't have a schedule and that contributes to lower productivity.  Compliant with meds.  Satisfied with meds.  Plan: Continue meds without changing. Adderall XR 75 daily Wellbutrin XL 300 AM Sertraline 25 daily Wellbutrin XL 300 AM  07/27/2020 appointment with the following noted:  More stress that [name]'s BF cheated and she moved back in.  She and [name] butt head leading to more stress.  Pt increased sertraline in the last 6 weeks DT this stress.  Then pt cried more and this is better controlled on 50 mg sertraline. She'll build a house behind them and so will be there for a year.   Plan: Continue meds without changing. Adderall XR 75 daily Wellbutrin XL 300 AM Sertraline 50 daily  As of  09/15/20 insurance no longer would cover any higher dose than Adderall XR 25 mg 2 daily.  It was suggested that she substitute one of the Adderall XR with Adzenys 15.7 mg.  02/07/2021 appointment with the following noted: Hasn't tried Adzenys yet.  Several  mos of health problems and couple of surgeries and Mo in law died in 30-Nov-2023 and never made it to [employer].  Does want to try it.  Misses Mo in Social worker.  Normal grief. Health under control. Bounces from one thing to another.  Which is better with higher dose Adderall.  Forgetful and distractible. Of note last med change was added sertraline 25 mg in February 2019 for excessive tearfulness.  It helped until recently as noted.  When [name] is hateful can cry.   Plan: Continue meds without changing. Adderall XR 50 daily plus Adzenys 15.7 daily Wellbutrin XL 300 AM Sertraline 50 daily  08/09/2021 appointment with the following noted: Stopped Adderall but still takes Adzenys prn.  Didn't seem like the Adderall made much difference but did have sleepiness for awhile which has worn off. Chronically sccattered but Adzenys works better than adderall Still a night owl. Goes to bed 2 and up at 1000.   No crying spells.  [name] can still hurt her feelings and not as good on Paxil as he was on sertraline. Patient reports stable mood and denies depressed or irritable moods.  Patient denies difficulty with sleep initiation or maintenance. Denies appetite disturbance.  Patient reports that energy and motivation have been good.  Patient denies any difficulty with concentration.  Patient denies any suicidal ideation. [name]'s health is stable.  She still feels he needs to be on meds.  Get's irritable and angry to easily.  Plan: Excessive tearfulness helped more  with increase to 50 mg sertraline. Continue meds without changing. Stopped Adderall XR 50 daily  Takes prn Adzenys 15.7 daily bc it helps more than Adderall Wellbutrin XL 300 AM Sertraline 50 daily  02/07/2022 appointment with the following noted: Sometimes takes 2 sertraline over easy crying if [name] is rude to her or critical. Can happen daily. Goes fishing with H and enjoys it.  Not depressed. Anxiety is pretty good but sometimes doesn't want to leave  the house bc not interested in seeing people bc they don't have sense any more.  Not sure it's worry but thinks about a lot of things.  General things.Long term homebody. Usually ok to go out to eat with H. No SE Sleep always fine. [name] says she's slow and it bothers him.  But she's just not a hurry type person.  Not particularly. No sig checking. [name] better on his meds but not better enough.  03/20/23 appt noted: Some memory issues.  Dr. [name] did MMSE and recall at 2 min 0/3.  Started donepezil 5 mg about 4 mos ago.  Tad bit of benefit.   Saw neuro Dr. [name] score 19 Mood about the same with occ down.  Anxiety is ok.   Sleep is good.  No sleep meds.   Plan: Excessive tearfulness returned so increase to 100 mg sertraline. Wellbutrin XL 300 AM Increase B12 dosage bc level low normal on supplement for memory Increase donepezil to 10 mg daily.  10/13/23 appt noted: Med: Wellbutrin XL 300 every morning, donepezil 10, sertraline 100. Most days tearfulness is better.  Overall is doing good.   Neuro FU [place] with pending in April  .  Overall cog is about the same with some things better though [name] would disagree. Wears hearing aid. No SE. Dx OSA and compliant with CPAP  She's retired for 2017. Loves it..  Tendency to white coat syndrome.  PE and increased BP med and she has readings that are within the normal range.  Past Psychiatric Medication Trials: Wellbutrin XL 300, Adderall XR 75 daily, sertraline 100 donepezil .   Review of Systems:  Review of Systems  Cardiovascular:  Negative for palpitations.  Neurological:  Negative for tremors.  Psychiatric/Behavioral:  Positive for decreased concentration. Negative for agitation, behavioral problems, confusion, dysphoric mood, hallucinations, self-injury, sleep disturbance and suicidal ideas. The patient is not nervous/anxious and is not hyperactive.   Depression under control.  Medications: I have reviewed the patient's  current medications.  Current Outpatient Medications  Medication Sig Dispense Refill   Ascorbic Acid (VITAMIN C CR) 1000 MG TBCR Take by mouth.     famotidine (PEPCID) 40 MG tablet TAKE ONE TABLET BY MOUTH AT BEDTIME 30 tablet 5   hydrochlorothiazide (HYDRODIURIL) 25 MG tablet      ipratropium (ATROVENT) 0.03 % nasal spray Place 1-2 sprays in each nostril 15 minutes  (prior to meals) to help with the drippy nose.Caution as this can be drying 30 mL 1   ipratropium (ATROVENT) 0.06 % nasal spray Can use two sprays in each nostril before eating as directed. 15 mL 5   loratadine (CLARITIN) 10 MG tablet Take 10 mg by mouth daily.     MYRBETRIQ 50 MG TB24 tablet Take 50 mg by mouth daily.     oxybutynin (DITROPAN XL) 15 MG 24 hr tablet Take 15 mg by mouth daily.     Probiotic Product (DIGESTIVE ADVANTAGE PO)  Take by mouth daily.     ramipril (ALTACE) 10 MG capsule Take 10 mg by mouth daily.     buPROPion (WELLBUTRIN XL) 300 MG 24 hr tablet Take 1 tablet (300 mg total) by mouth every morning. 90 tablet 3   donepezil (ARICEPT) 10 MG tablet Take 1 tablet (10 mg total) by mouth daily. 90 tablet 3   sertraline (ZOLOFT) 100 MG tablet Take 1 tablet (100 mg total) by mouth daily. 90 tablet 3   No current facility-administered medications for this visit.    Medication Side Effects: None  Allergies:  Allergies  Allergen Reactions   Sulfamethoxazole Swelling    Past Medical History:  Diagnosis Date   High blood pressure     Family History  Problem Relation Age of Onset   High blood pressure Mother    Heart Problems Father    Bipolar disorder Paternal Grandmother     Social History   Socioeconomic History   Marital status: Married    Spouse name: Not on file   Number of children: 2   Years of education: 16   Highest education level: Not on file  Occupational History   Not on file  Tobacco Use   Smoking status: Never   Smokeless tobacco: Never  Substance and Sexual Activity    Alcohol use: Not Currently   Drug use: Never   Sexual activity: Yes    Partners: Male    Birth control/protection: None  Other Topics Concern   Not on file  Social History Narrative   Right handed    One story home   Lives with husband   Retired   Occasionally tea, husband is Engineer, agricultural   Social Drivers of Corporate investment banker Strain: Low Risk  (09/25/2022)   Received from [employer], [facility]   Overall Financial Resource Strain (CARDIA)    Difficulty of Paying Living Expenses: Not hard at all  Food Insecurity: No Food Insecurity (09/25/2022)   Received from [place], [facility]   Hunger Vital Sign    Worried About Running Out of Food in the Last Year: Never true    Ran Out of Food in the Last Year: Never true  Transportation Needs: No Transportation Needs (09/25/2022)   Received from [facility], [facility]   PRAPARE - Transportation    Lack of Transportation (Medical): No    Lack of Transportation (Non-Medical): No  Physical Activity: Not on file  Stress: No Stress Concern Present (10/24/2020)   Received from [facility], [facility] - Occupational Stress Questionnaire    Feeling of Stress : Only a little  Social Connections: Unknown (11/19/2021)   Received from [facility], [facility]   Social Network    Social Network: Not on file  Intimate Partner Violence: Unknown (10/11/2021)   Received from [facility], [facility]   HITS    Physically Hurt: Not on file    Insult or Talk Down To: Not on file    Threaten Physical Harm: Not on file    Scream or Curse: Not on file    Past Medical History, Surgical history, Social history, and Family history were reviewed and updated as appropriate.   Please see review of systems for further details on the patient's review from today.   Objective:   Physical Exam:  There were no vitals taken for this visit.  Physical Exam Constitutional:       General: She is  not in acute distress.    Appearance: She is well-developed.  Musculoskeletal:        General: No deformity.  Neurological:     Mental Status: She is alert and oriented to person, place, and time.     Motor: No tremor.     Coordination: Coordination normal.     Gait: Gait normal.  Psychiatric:        Attention and Perception: Attention and perception normal.        Mood and Affect: Mood is not anxious or depressed. Affect is not blunt or inappropriate.        Speech: Speech normal.        Behavior: Behavior normal.        Thought Content: Thought content normal. Thought content is not delusional. Thought content does not include homicidal or suicidal ideation. Thought content does not include suicidal plan.        Cognition and Memory: Cognition normal. She exhibits impaired recent memory.        Judgment: Judgment normal.     Comments: Insight intact. No auditory or visual hallucinations. No delusions.       Lab Review:     Component Value Date/Time   NA 140 04/05/2022 1344   K 3.8 04/05/2022 1344   CL 100 04/05/2022 1344   CO2 27 04/05/2022 1344   GLUCOSE 107 (H) 04/05/2022 1344   BUN 14 04/05/2022 1344   CREATININE 0.77 04/05/2022 1344   CALCIUM 9.6 04/05/2022 1344   PROT 6.7 04/05/2022 1344   ALBUMIN 4.2 04/05/2022 1344   AST 15 04/05/2022 1344   ALT 16 04/05/2022 1344   ALKPHOS 110 04/05/2022 1344   BILITOT 0.3 04/05/2022 1344       Component Value Date/Time   WBC 5.6 04/05/2022 1344   RBC 5.03 04/05/2022 1344   HGB 14.3 04/05/2022 1344   HCT 43.9 04/05/2022 1344   MCV 87 04/05/2022 1344   MCH 28.4 04/05/2022 1344   MCHC 32.6 04/05/2022 1344   RDW 13.2 04/05/2022 1344   LYMPHSABS 1.4 04/05/2022 1344   EOSABS 0.3 04/05/2022 1344   BASOSABS 0.1 04/05/2022 1344    No results found for: "POCLITH", "LITHIUM"   No results found for: "PHENYTOIN", "PHENOBARB", "VALPROATE", "CBMZ"   .res Assessment: Plan:    Deanda was seen today for follow-up,  depression, add and memory loss.  Diagnoses and all orders for this visit:  Recurrent major depression in full remission (HCC) -     buPROPion (WELLBUTRIN XL) 300 MG 24 hr tablet; Take 1 tablet (300 mg total) by mouth every morning. -     sertraline (ZOLOFT) 100 MG tablet; Take 1 tablet (100 mg total) by mouth daily.  Attention deficit hyperactivity disorder (ADHD), predominantly inattentive type -     buPROPion (WELLBUTRIN XL) 300 MG 24 hr tablet; Take 1 tablet (300 mg total) by mouth every morning.  Memory impairment -     donepezil (ARICEPT) 10 MG tablet; Take 1 tablet (10 mg total) by mouth daily.  Obstructive sleep apnea   30 min appt. Mood and anxiety pretty stable and tearfulness better with increased sertraline..  Chronic ADD only marginally controlled but on max meds.  And is retired now so less demands.  hearing aids for mental health reasons.  It's helped.   Disc value of CPAP for severe OSA.  She's compliant.  Excessive tearfulness resolved with 100 mg sertraline. Call if any problems with increasing it.  Disc SE  Wellbutrin  XL 300 AM  Continue B12 dosage bc level low normal on supplement for memory continue donepezil to 10 mg daily.  FU 12 mos  Meredith Staggers, MD, DFAPA  Please see After Visit Summary for patient specific instructions.  Future Appointments  Date Time Provider Department Center  10/29/2023  1:00 PM Annice Pih [facility] None  10/29/2023  2:00 PM [facility] None  11/05/2023  2:30 PM Kdeiss, Bianca [facility] None  11/06/2023  9:30 AM Cottle, Steva Ready., MD [facility] None  04/05/2024  3:30 PM Gwynneth Munson Sung Amabile, PA-C [facility] None    No orders of the defined types were placed in this encounter.      -------------------------------

## 2023-10-14 ENCOUNTER — Other Ambulatory Visit: Payer: Self-pay | Admitting: Psychiatry

## 2023-10-14 DIAGNOSIS — F3342 Major depressive disorder, recurrent, in full remission: Secondary | ICD-10-CM

## 2023-10-14 DIAGNOSIS — F900 Attention-deficit hyperactivity disorder, predominantly inattentive type: Secondary | ICD-10-CM

## 2023-10-21 ENCOUNTER — Other Ambulatory Visit: Payer: Self-pay | Admitting: Allergy and Immunology

## 2023-10-23 ENCOUNTER — Ambulatory Visit: Payer: Self-pay

## 2023-10-23 ENCOUNTER — Institutional Professional Consult (permissible substitution): Payer: Medicare Other | Admitting: Psychology

## 2023-10-29 ENCOUNTER — Ambulatory Visit (INDEPENDENT_AMBULATORY_CARE_PROVIDER_SITE_OTHER): Payer: Medicare Other | Admitting: Psychology

## 2023-10-29 ENCOUNTER — Ambulatory Visit: Payer: Self-pay | Admitting: Psychology

## 2023-10-29 DIAGNOSIS — R4189 Other symptoms and signs involving cognitive functions and awareness: Secondary | ICD-10-CM

## 2023-10-29 DIAGNOSIS — G3184 Mild cognitive impairment, so stated: Secondary | ICD-10-CM

## 2023-10-29 DIAGNOSIS — F418 Other specified anxiety disorders: Secondary | ICD-10-CM

## 2023-10-29 NOTE — Progress Notes (Unsigned)
 NEUROPSYCHOLOGICAL EVALUATION [place]. [facility]  [place] Department of Neurology  Date of Evaluation: 10/29/2023  REASON FOR REFERRAL   Taylor Oconnell is a 68 year old, right-handed, White female with 16 years of formal education. She was referred for neuropsychological evaluation by Tex Filbert, PA-C, to assess current neurocognitive functioning, document potential cognitive deficits, and assist with treatment planning. This is her first neuropsychological evaluation.  SUMMARY OF RESULTS   Premorbid cognitive abilities are estimated to be in the average range based on word reading and sociodemographic factors. On assessment today, performance was below expectations on tasks of response inhibition and inhibition/switching, category fluency, and aspects of learning/memory. Regarding the latter, immediate and delayed recall were below expectations across all three memory tasks--word list, short stories, and shapes. She did not consistently recall the same items across encoding trials on most measures, suggesting difficulty with initial acquisition; this likely limited her delayed recall of the information. Recognition performance was variable. She recognized story details more accurately than items from the word list or shapes. Although word list recognition was relatively stronger than delayed recall, the overall score was low due to three false positive errors. However, this score may underestimate her recognition ability, as two of the three false positive errors were intrusions from the earlier recall trials. Shape recognition was low--not due to false positives--but likely due to poor initial encoding.  Remaining performances were within age-related expectations, including on tasks of auditory and visual attention/working memory, processing speed, alternating attention, verbal and visual abstract reasoning, phonemic fluency, confrontation naming, visuospatial skills, and fine motor  dexterity. On self-report questionnaires, she did not endorse clinically-elevated levels of depression or anxiety.  DIAGNOSTIC IMPRESSION   Results of the current evaluation indicated difficulties with aspects of learning/memory (particularly in encoding and retrieval) along with variable executive functioning. These deficits are present in the context of intact functional independence, supporting a diagnosis of mild cognitive impairment. Etiology of the deficits observed today is likely multifactorial, including moderate cerebrovascular disease, persistent mood symptoms, a longstanding history of attentional difficulties (for which she is no longer on medication), and possible medication side effects (e.g., oxybutynin). Although her cognitive profile does not currently suggest an Alzheimer's disease process, memory was her most affected domain. As such, I recommend follow-up neuropsychological assessment to monitor for any changes that could indicate an evolving process.  ICD-10 Codes: G31.84 Mild cognitive impairment; F41.8 Depression with anxiety  RECOMMENDATIONS   A repeat neuropsychological evaluation in 12-18 months (or sooner if functional decline is noted) is recommended.  Patient has already been prescribed a medication (i.e., donepezil ) aimed at addressing memory loss. She is encouraged to continue taking this medication as prescribed. It is important to highlight that this medication has been shown to slow functional decline in some individuals.  Continue mental health treatment, especially since emotional distress can exacerbate cognitive difficulties. In addition to medication, therapy or other supportive interventions may also be beneficial. Addressing emotional well-being may help improve overall functioning and support cognitive health.  Patient is encouraged to attend to lifestyle factors for brain health (e.g., medication adherence, regular physical exercise, good nutrition habits  with consideration of the MIND-DASH diet, regular participation in cognitively-stimulating activities, and general stress management techniques), which are likely to have benefits for both emotional adjustment and cognition. In fact, in addition to promoting good general health, regular exercise incorporating aerobic activities (e.g., brisk walking, jogging, cycling, etc.) has been demonstrated to be a very effective treatment for depression and stress, with similar efficacy  rates to both antidepressant medication and psychotherapy.   Continue participating in activities that you find enjoyable and fulfilling, whether that be hobbies, socializing with loved ones, or being outdoors. This can improve mood, increase motivation, and offer cognitive stimulation.  Patient should consider implementing compensatory strategies to maximize independence and maintain daily functioning. Examples include:  -Adhere to routine. Compensatory strategies work best when they are used consistently. Use a planner, calendar, or white board that has the schedule and important events for the day clearly listed to reference and cross off when tasks are complete.  -Ask for written information, especially if it is new or unfamiliar (e.g., information provided at a doctor's appointment).  -Create an organized environment. Keep items that can be easily misplaced in a sensible location and get into the habit of always returning the items to those places.  -Pay attention and reduce distractions. Make a point of focusing attention on information you want to remember. One-on-one interaction is more likely to facilitate attention and minimize distraction. Make eye contact and repeat the information out loud after you hear it. Reduce interruptions or distractions especially when attempting to learn new information.  -Create associations. When learning something new, think about and understand the information. Explain it in your own words or  try to associate it with something you already know. Take notes to help remember important details. -Evaluate goals and plan accordingly. When confronted by many different tasks, begin by making a list that prioritizes each task and estimates the time it will take to complete. Break down complicated tasks into smaller, more manageable steps.  -Focus on one task at a time and complete each task before starting another. Avoid multitasking.   DISPOSITION   Patient will follow up with the referring provider, Ms. Wertman. She should return for repeat neuropsychological testing in 12-18 months to monitor her course and assist with diagnosis and treatment planning. She will be provided verbal feedback in approximately one week regarding the findings and impression during this visit.  The remainder of the report includes the details of the patient's background and a table of results from the current evaluation, which support the summary and recommendations described above.  BACKGROUND   History of Presenting Illness: The following information was obtained from a review of medical records and an interview with the patient. Patient established care with neurology on 01/02/2023 due to memory concerns in the setting of ADHD, depression, and hearing loss. She maintains functional independence. Brief cognitive screening during her initial visit in June was below expectations (i.e., MoCA = 19/30), but more recent screening in March was within expectation (i.e., MMSE = 28/30).  Cognitive Functioning: During today's appointment, the patient reported occasional memory problems, noting that while she sometimes knows what she is thinking of, it takes her longer to recall it. She also mentioned longstanding attentional difficulties, which she does not feel are bothersome. She denied any issues with word finding, navigation, and executive functioning.  Physical Functioning: Patient denied difficulties with sleep  initiation and maintenance. She is compliant with using her CPAP machine. Appetite is stable. No changes to sense of taste or smell were reported. Her vision is stable, and she manages hearing loss with bilateral hearing aids. She denied balance problems, falls, and tremor.  Emotional Functioning: Patient reported her current mood as "good and happy 90% of the time." She denied suicidal ideation. She has a good social support system, including her sister and friends, and regularly spends time with them.  Imaging: MRI  of the brain (02/02/2023) documented mild generalized cerebral atrophy and moderate chronic small vessel ischemic disease within the cerebral white matter and pons, with prominent perivascular spaces in the bilateral cerebral hemispheres. 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  Other Relevant Medical History: Remarkable for hypertension, sleep apnea, and overactive bladder. No history of stroke, CNS infection, head injury, or seizure was reported.  Current Medications and Vitamins/Supplements: Per record, bupropion , donepezil , famotidine , hydrochlorothiazide, ipratropium nasal spray, loratadine, Myrbetriq, oxybutynin, probiotic product, ramipril, sertraline , and vitamin C.  Functional Status: Patient independently performs all basic and instrumental activities of daily living without reported difficulty.   Family Neurological History: Remarkable for memory problems in her father, around the age of 19. A strong family history of dementia was previously documented, but the patient denies this today.  Psychiatric History: Remarkable for depression, currently managed with medication prescribed by her psychiatrist, Dr. Toi Foster. She also endorsed situational anxiety. She does not participate in counseling at this time. History of suicidal ideation, hallucinations, and psychiatric hospitalizations was not reported.  Substance Use History: Patient denied current use of alcohol, nicotine, marijuana,  and illicit substances.  Social and Developmental History: Patient was born in [place], [place]. History of perinatal complications and developmental delays was not reported. She is married and has two children. She lives with her husband, and their daughter is temporarily staying with them.  Educational and Occupational History: No history of childhood learning disability, special education services, or grade retention was reported. Patient suspects she had undiagnosed ADHD in childhood. She described her average grades in school as Cs and Ds, with occasional Bs, and noted that math had always been a personal weakness. 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  BEHAVIORAL OBSERVATIONS   Patient arrived early and was unaccompanied. She ambulated independently and without gait disturbance. She was alert and oriented with the exception of the date (i.e., stated it was the 26th instead of the 23rd) . She was appropriately groomed and dressed for the setting. No significant sensory or motor abnormalities were observed. Vision (with glasses) and hearing (with bilateral hearing aids) were adequate for testing purposes. Speech was of normal rate, prosody, and volume. No conversational word-finding difficulties, paraphasic errors, or dysarthria were observed. Comprehension was conversationally intact. Thought processes were linear, logical, and coherent. Thought content was organized and devoid of delusions. Insight appeared intact. Affect was tearful, and she expressed feeling anxious about the appointment. She was cooperative and gave adequate effort during testing, including on standalone and embedded measures of performance validity. Results are thought to accurately reflect her cognitive functioning at this time.  NEUROPSYCHOLOGICAL TESTING RESULTS   Tests Administered: Animal  Naming Test; Beck Anxiety Inventory (BAI); Beck Depression Inventory II (BDI-II); Brief Visuospatial Memory Test-Revised (BVMT-R) - Form 1; Controlled Oral Word Association Test (COWAT): FAS; Delis-Kaplan Air cabin crew System (D-KEFS) - Subtest(s): Color-Word Interference Test; Grooved Pegboard Test; [employer] Learning Test Revised (HVLT-R) - From 1; Judgment of Line Orientation (JLO) - Form H; Neuropsychological Assessment Battery (NAB) - Subtest(s): Naming Form 1; Standalone performance validity test (PVT); Test of Premorbid Functioning (TOPF); Trail Making Test (TMT); Wechsler Adult Intelligence Scale Fifth Edition (WAIS-5) - Subtest(s): Similarities, Clinical cytogeneticist, Matrix Reasoning, Digits Forward, Digit Sequencing, Coding, Running Digits, Symbol Search, Digits Backward, Symbol Span; and Wechsler Memory Scale Fourth Edition (WMS-IV) - Subtest(s): Logical Memory (LM).  Test results are provided in the table  below. Whenever possible, the patient's scores were compared against age-, sex-, and education-corrected normative samples. Interpretive descriptions are based on the AACN consensus conference statement on uniform labeling (Guilmette et al., 2020).  PREMORBID FUNCTIONING RAW  RANGE  TOPF 38 StdS=98 Average  ATTENTION & WORKING MEMORY RAW  RANGE  WAIS-5 Digit Sequencing -- ss=11 Average  WAIS-5 Running Digits -- ss=8 Average  WAIS-5 Digits Forward -- ss=10 Average  WAIS-5 Digits Backward -- ss=7 Low Average  WAIS-5 Symbol Span -- ss=8 Average  PROCESSING SPEED RAW  RANGE  Trails A 35''1e T=46 Average  WAIS-5 Coding  -- ss=8 Average  WAIS-5 Symbol Search -- ss=8 Average  DKEFS CWIT Color Naming 31''1e ss=11 Average  DKEFS CWIT Word Reading 24''1e ss=10 Average  EXECUTIVE FUNCTION RAW  RANGE  Trails B 111''0e T=38 Low Average  WAIS-5 Matrix Reasoning -- ss=13 High Average  WAIS-5 Similarities -- ss=10 Average  COWAT Letter Fluency 9+8+13 T=39 Low Average  DKEFS CWIT Inhibition  92''9e ss=6 Low Average  DKEFS CWIT Inhibition/Switching 132''10e ss=2 Exceptionally Low  LANGUAGE RAW  RANGE  COWAT Letter Fluency 9+8+13 T=39 Low Average  Animal Naming Test 11 T=27 Exceptionally Low  NAB Naming Test 30/31 T=55 WNL  VISUOSPATIAL RAW    WAIS-5 Block Design -- ss=9 Average  JLO C/S=25/30 56%ile Average  BVMT-R Copy Trial 12/12 -- WNL  VERBAL LEARNING & MEMORY RAW  RANGE  HVLT Learning Trials (3+6+5)/36 T=31 Below Average  HVLT Delayed Recall 0/12 T=18 Exceptionally Low  HVLT Recognition Hits 10 -- --  HVLT Recognition False Positives 3 -- --  HVLT Discrimination Index 7 T=31 Below Average  WMS-IV LM-I  (4+5+10)/53 ss=5 Below Average  WMS-IV LM-II  (1+7)/39 ss=5 Below Average  WMS-IV LM Recognition  (7+12)/23 51-75%ile Average  VISUOSPATIAL LEARNING & MEMORY RAW  RANGE  BVMT-R Total Recall (2+3+5)/36 T=30 Below Average  BVMT-R Delayed Recall 2/12 T=23 Exceptionally Low  BVMT-R Percent Retained 40 <1%ile Exceptionally Low  BVMT-R Recognition Hits 3 3-5%ile Below Average  BVMT-R Recognition False Alarms 0 >16%ile WNL  BVMT-R Recognition Discrimination Index 3 3-5%ile Below Average  FINE MOTOR DEXTERITY RAW  RANGE  Grooved Pegboard (Dominant Hand) 72''0d T=48 Average  Grooved Pegboard (Non-Dominant Hand) 82''0d T=49 Average  QUESTIONNAIRES RAW  RANGE  BDI 7 -- Minimal  BAI 6 -- Minimal  *Note: ss = scaled score; StdS = standard score; T = t-score; C/S = corrected raw score; WNL = within normal limits; BNL= below normal limits; D/C = discontinued. Scores from skewed distributions are typically interpreted as WNL (>=16th %ile) or BNL (<16th %ile).   INFORMED CONSENT   Patient was provided with a verbal description of the nature and purpose of the neuropsychological evaluation. Also reviewed were the foreseeable risks and/or discomforts and benefits of the procedure, limits of confidentiality, and mandatory reporting requirements of this provider. Patient was given the  opportunity to have their questions answered. Oral consent to participate was provided by the patient.   This report was prepared as part of a clinical evaluation and is not intended for forensic use.  SERVICE   This evaluation was conducted by Janice Meeter, Psy.D. In addition to time spent directly with the patient, total professional time includes record review, integration of relevant medical history, test selection, interpretation of findings, and report preparation. A technician, Arnulfo Larch, B.S., provided testing and scoring assistance for 160 minutes.  Psychiatric Diagnostic Evaluation Services (Professional): 16109 x 1 Neuropsychological Testing Evaluation Services (Professional): 60454 x 1 Neuropsychological Testing  Evaluation Services (Professional): 02725 x 2 Neuropsychological Test Administration and Scoring Radiographer, therapeutic): [phone] x 1 Neuropsychological Test Administration and Scoring (Technician): [phone] x 4  This report was generated using voice recognition software. While this document has been carefully reviewed, transcription errors may be present. I apologize in advance for any inconvenience. Please contact me if further clarification is needed.            Janice Meeter, Psy.D.             Neuropsychologist

## 2023-10-29 NOTE — Progress Notes (Signed)
   Psychometrician Note   Cognitive testing was administered to Taylor Oconnell by Arnulfo Larch, B.S. (psychometrist) under the supervision of Dr. Janice Meeter, Psy.D., licensed psychologist on 10/29/2023. Taylor Oconnell did not appear overtly distressed by the testing session per behavioral observation or responses across self-report questionnaires. Rest breaks were offered.    The battery of tests administered was selected by Dr. Janice Meeter, Psy.D. with consideration to Taylor Oconnell's current level of functioning, the nature of her symptoms, emotional and behavioral responses during interview, level of literacy, observed level of motivation/effort, and the nature of the referral question. This battery was communicated to the psychometrist. Communication between Dr. Janice Meeter, Psy.D. and the psychometrist was ongoing throughout the evaluation and Dr. Janice Meeter, Psy.D. was immediately accessible at all times. Dr. Janice Meeter, Psy.D. provided supervision to the psychometrist on the date of this service to the extent necessary to assure the quality of all services provided.    Taylor Oconnell will return within approximately 1-2 weeks for an interactive feedback session with Dr. Donavon Fudge at which time her test performances, clinical impressions, and treatment recommendations will be reviewed in detail. Taylor Oconnell understands she can contact our office should she require our assistance before this time.  A total of 160 minutes of billable time were spent face-to-face with Taylor Oconnell by the psychometrist. This includes both test administration and scoring time. Billing for these services is reflected in the clinical report generated by Dr. Janice Meeter, Psy.D.  This note reflects time spent with the psychometrician and does not include test scores or any clinical interpretations made by Dr. Donavon Fudge. The full report will follow in a separate note.

## 2023-10-30 ENCOUNTER — Encounter: Payer: Medicare Other | Admitting: Psychology

## 2023-11-05 ENCOUNTER — Ambulatory Visit (INDEPENDENT_AMBULATORY_CARE_PROVIDER_SITE_OTHER): Payer: Medicare Other | Admitting: Psychology

## 2023-11-05 DIAGNOSIS — F418 Other specified anxiety disorders: Secondary | ICD-10-CM | POA: Diagnosis not present

## 2023-11-05 DIAGNOSIS — G3184 Mild cognitive impairment, so stated: Secondary | ICD-10-CM

## 2023-11-05 NOTE — Progress Notes (Signed)
   NEUROPSYCHOLOGY FEEDBACK SESSION [place]. [facility]  [place] Department of Neurology  Date of Feedback Session: 11/05/2023  REASON FOR REFERRAL   Taylor Oconnell is a 68 year old, right-handed, White female with 16 years of formal education. She was referred for neuropsychological evaluation by Tex Filbert, PA-C, to assess current neurocognitive functioning, document potential cognitive deficits, and assist with treatment planning. This is her first neuropsychological evaluation.  FEEDBACK   Patient completed a comprehensive neuropsychological evaluation on 10/29/2023. Please refer to that encounter for the full report and recommendations. Briefly, results indicated difficulties with aspects of learning/memory (particularly in encoding and retrieval) along with variable executive functioning. These deficits are present in the context of intact functional independence, supporting a diagnosis of mild cognitive impairment. Etiology of the deficits observed today is likely multifactorial, including moderate cerebrovascular disease, persistent mood symptoms, a longstanding history of attentional difficulties (for which she is no longer on medication), and possible medication side effects (e.g., oxybutynin). Although her cognitive profile does not currently suggest an Alzheimer's disease process, memory was her most affected domain. As such, I recommend follow-up neuropsychological assessment to monitor for any changes that could indicate an evolving process.   Today, the patient was accompanied by her husband. They were provided verbal feedback regarding the findings and impression during this visit, and their questions were answered. A copy of the report was provided at the conclusion of the visit.  DISPOSITION   Patient will follow up with the referring provider, Ms. Wertman. She should return for repeat neuropsychological testing in 12-18 months to monitor her course and assist with  diagnosis and treatment planning. She will be provided verbal feedback in approximately one week regarding the findings and impression during this visit.  SERVICE   This feedback session was conducted by Janice Meeter, Psy.D. One unit of 16109 was billed for Dr. Donavon Fudge' time spent in preparing, conducting, and documenting the current feedback session.  This report was generated using voice recognition software. While this document has been carefully reviewed, transcription errors may be present. I apologize in advance for any inconvenience. Please contact me if further clarification is needed.

## 2023-11-06 ENCOUNTER — Ambulatory Visit: Admitting: Psychiatry

## 2023-11-09 ENCOUNTER — Ambulatory Visit

## 2023-12-10 ENCOUNTER — Other Ambulatory Visit: Payer: Self-pay | Admitting: Allergy and Immunology

## 2024-01-31 ENCOUNTER — Other Ambulatory Visit: Payer: Self-pay | Admitting: Allergy and Immunology

## 2024-04-05 ENCOUNTER — Ambulatory Visit: Admitting: Physician Assistant

## 2024-04-11 ENCOUNTER — Other Ambulatory Visit: Payer: Self-pay | Admitting: Allergy and Immunology

## 2024-04-28 ENCOUNTER — Other Ambulatory Visit: Payer: Self-pay | Admitting: Urology

## 2024-05-20 ENCOUNTER — Encounter (HOSPITAL_COMMUNITY): Payer: Self-pay

## 2024-05-20 NOTE — Patient Instructions (Addendum)
 SURGICAL WAITING ROOM VISITATION Patients having surgery or a procedure may have no more than 2 support people in the waiting area - these visitors may rotate.    Children under the age of 33 must have an adult with them who is not the patient.  If the patient needs to stay at the hospital during part of their recovery, the visitor guidelines for inpatient rooms apply. Pre-op nurse will coordinate an appropriate time for 1 support person to accompany patient in pre-op.  This support person may not rotate.    Please refer to the [facility] website for the visitor guidelines for Inpatients (after your surgery is over and you are in a regular room).       Your procedure is scheduled on: 06-02-24   Report to [facility] Main Entrance    Report to admitting at 9:45 AM   Call this number if you have problems the morning of surgery [phone]   Follow a clear liquid diet the day before surgery   Do not eat food or drink liquids :After Midnight.           If you have questions, please contact your surgeon's office.   FOLLOW BOWEL PREP AND ANY ADDITIONAL PRE OP INSTRUCTIONS YOU RECEIVED FROM YOUR SURGEON'S OFFICE!!!    - Miralax (follow instructions from surgeon's office)  Mix contents of container with Gatorade or G Zero. For 119 g container, mix with 32 oz. For 238 g mix with 64 oz. Do NOT mix with red Gatorade or G Zero.  Miralax powder is supplied by pharmacy    Oral Hygiene is also important to reduce your risk of infection.                                    Remember - BRUSH YOUR TEETH THE MORNING OF SURGERY WITH YOUR REGULAR TOOTHPASTE   Do NOT smoke after Midnight   Take these medicines the morning of surgery with A SIP OF WATER:    Bupropion    Donepezil    Gemtesa   Sertraline   Stop all vitamins and herbal supplements 7 days before surgery  Bring CPAP mask and tubing day of surgery.                              You may not have any metal on your body  including hair pins, jewelry, and body piercing             Do not wear make-up, lotions, powders, perfumes or deodorant  Do not wear nail polish including gel and S&S, artificial/acrylic nails, or any other type of covering on natural nails including finger and toenails. If you have artificial nails, gel coating, etc. that needs to be removed by a nail salon please have this removed prior to surgery or surgery may need to be canceled/ delayed if the surgeon/ anesthesia feels like they are unable to be safely monitored.   Do not shave  48 hours prior to surgery.      Do not bring valuables to the hospital. [facility] IS NOT RESPONSIBLE   FOR VALUABLES.   Contacts, dentures or bridgework may not be worn into surgery.   Bring small overnight bag day of surgery.   DO NOT BRING YOUR HOME MEDICATIONS TO THE HOSPITAL. PHARMACY WILL DISPENSE MEDICATIONS LISTED ON YOUR MEDICATION LIST TO YOU  DURING YOUR ADMISSION IN THE HOSPITAL!               Please read over the following fact sheets you were given: IF YOU HAVE QUESTIONS ABOUT YOUR PRE-OP INSTRUCTIONS PLEASE CALL [phone] Gwen  If you received a COVID test during your pre-op visit  it is requested that you wear a mask when out in public, stay away from anyone that may not be feeling well and notify your surgeon if you develop symptoms. If you test positive for Covid or have been in contact with anyone that has tested positive in the last 10 days please notify you surgeon.  [facility] - Preparing for Surgery Before surgery, you can play an important role.  Because skin is not sterile, your skin needs to be as free of germs as possible.  You can reduce the number of germs on your skin by washing with CHG (chlorahexidine gluconate) soap before surgery.  CHG is an antiseptic cleaner which kills germs and bonds with the skin to continue killing germs even after washing. Please DO NOT use if you have an allergy  to CHG or antibacterial soaps.  If  your skin becomes reddened/irritated stop using the CHG and inform your nurse when you arrive at Short Stay. Do not shave (including legs and underarms) for at least 48 hours prior to the first CHG shower.  You may shave your face/neck.  Please follow these instructions carefully:  1.  Shower with CHG Soap the night before surgery ONLY (DO NOT USE THE SOAP THE MORNING OF SURGERY).  2.  If you choose to wash your hair, wash your hair first as usual with your normal  shampoo.  3.  After you shampoo, rinse your hair and body thoroughly to remove the shampoo.                             4.  Use CHG as you would any other liquid soap.  You can apply chg directly to the skin and wash.  Gently with a scrungie or clean washcloth.  5.  Apply the CHG Soap to your body ONLY FROM THE NECK DOWN.   Do   not use on face/ open                           Wound or open sores. Avoid contact with eyes, ears mouth and   genitals (private parts).                       Wash face,  Genitals (private parts) with your normal soap.             6.  Wash thoroughly, paying special attention to the area where your    surgery  will be performed.  7.  Thoroughly rinse your body with warm water from the neck down.  8.  DO NOT shower/wash with your normal soap after using and rinsing off the CHG Soap.                9.  Pat yourself dry with a clean towel.            10.  Wear clean pajamas.            11.  Place clean sheets on your bed the night of your first shower and do not  sleep with pets. Day  of Surgery : Do not apply any CHG, lotions/deodorants the morning of surgery.  Please wear clean clothes to the hospital/surgery center.  FAILURE TO FOLLOW THESE INSTRUCTIONS MAY RESULT IN THE CANCELLATION OF YOUR SURGERY  PATIENT SIGNATURE_________________________________  NURSE SIGNATURE__________________________________  ________________________________________________________________________

## 2024-05-20 NOTE — Progress Notes (Addendum)
 Date of COVID positive in last 90 days:  No  PCP - Tanda Bame, MD Cardiologist - N/A Neurology - Camie Sevin, PA-C  Chest x-ray - N/A EKG - 05-25-24 Epic Stress Test - N/A ECHO - 10-06-20 Epic Cardiac Cath - N/A Pacemaker/ICD device last checked:N/A Spinal Cord Stimulator:N/A  Bowel Prep -  Yes, patient given instructions on Miralax prep  Sleep Study - Yes, +sleep apnea CPAP - Yes  Prediabetes  Fasting Blood Sugar - does not check  Checks Blood Sugar _____ times a day  Last dose of GLP1 agonist-  N/A GLP1 instructions:  Do not take after     Last dose of SGLT-2 inhibitors-  N/A SGLT-2 instructions:  Do not take after    Blood Thinner Instructions: N/A Last dose:   Time: Aspirin Instructions:N/A Last Dose:  Activity level:  Can go up a flight of stairs and perform activities of daily living without stopping and without symptoms of chest pain or shortness of breath.  Anesthesia review: Systolic murmur  Patient denies shortness of breath, fever, cough and chest pain at PAT appointment  Patient verbalized understanding of instructions that were given to them at the PAT appointment. Patient was also instructed that they will need to review over the PAT instructions again at home before surgery.

## 2024-05-25 ENCOUNTER — Other Ambulatory Visit: Payer: Self-pay

## 2024-05-25 ENCOUNTER — Encounter (HOSPITAL_COMMUNITY)
Admission: RE | Admit: 2024-05-25 | Discharge: 2024-05-25 | Disposition: A | Discharge status: Home / Self Care | Source: Ambulatory Visit | Attending: Urology | Admitting: Urology

## 2024-05-25 ENCOUNTER — Encounter (HOSPITAL_COMMUNITY): Payer: Self-pay

## 2024-05-25 VITALS — BP 142/86 | HR 72 | Temp 98.4°F | Resp 16 | Ht 63.0 in | Wt 215.4 lb

## 2024-05-25 DIAGNOSIS — Z01818 Encounter for other preprocedural examination: Secondary | ICD-10-CM | POA: Insufficient documentation

## 2024-05-25 DIAGNOSIS — E119 Type 2 diabetes mellitus without complications: Secondary | ICD-10-CM | POA: Insufficient documentation

## 2024-05-25 DIAGNOSIS — R001 Bradycardia, unspecified: Secondary | ICD-10-CM | POA: Insufficient documentation

## 2024-05-25 HISTORY — DX: Prediabetes: R73.03

## 2024-05-25 HISTORY — DX: Cardiac murmur, unspecified: R01.1

## 2024-05-25 HISTORY — DX: Gastro-esophageal reflux disease without esophagitis: K21.9

## 2024-05-25 HISTORY — DX: Type 2 diabetes mellitus without complications: E11.9

## 2024-05-25 HISTORY — DX: Sleep apnea, unspecified: G47.30

## 2024-05-25 LAB — CBC
HCT: 44.4 % (ref 36.0–46.0)
Hemoglobin: 14.9 g/dL (ref 12.0–15.0)
MCH: 28.5 pg (ref 26.0–34.0)
MCHC: 33.6 g/dL (ref 30.0–36.0)
MCV: 85.1 fL (ref 80.0–100.0)
Platelets: 267 K/uL (ref 150–400)
RBC: 5.22 MIL/uL — ABNORMAL HIGH (ref 3.87–5.11)
RDW: 13.1 % (ref 11.5–15.5)
WBC: 5.5 K/uL (ref 4.0–10.5)
nRBC: 0 % (ref 0.0–0.2)

## 2024-05-31 ENCOUNTER — Other Ambulatory Visit: Payer: Self-pay | Admitting: *Deleted

## 2024-05-31 ENCOUNTER — Ambulatory Visit: Admitting: Allergy and Immunology

## 2024-05-31 MED ORDER — FAMOTIDINE 40 MG PO TABS: 40.0000 mg | ORAL_TABLET | Freq: Every day | ORAL | 0 refills | Status: AC

## 2024-06-02 ENCOUNTER — Encounter (HOSPITAL_COMMUNITY)
Admission: RE | Disposition: A | Discharge status: Home / Self Care | Payer: Self-pay | Source: Home / Self Care | Attending: Urology

## 2024-06-02 ENCOUNTER — Encounter (HOSPITAL_COMMUNITY): Payer: Self-pay | Admitting: Urology

## 2024-06-02 ENCOUNTER — Inpatient Hospital Stay (HOSPITAL_COMMUNITY): Admitting: Anesthesiology

## 2024-06-02 ENCOUNTER — Inpatient Hospital Stay (HOSPITAL_COMMUNITY): Payer: Self-pay | Admitting: Physician Assistant

## 2024-06-02 ENCOUNTER — Inpatient Hospital Stay (HOSPITAL_COMMUNITY)
Admission: RE | Admit: 2024-06-02 | Discharge: 2024-06-03 | DRG: 658 | Disposition: A | Discharge status: Home / Self Care | Attending: Urology | Admitting: Urology

## 2024-06-02 ENCOUNTER — Other Ambulatory Visit: Payer: Self-pay

## 2024-06-02 DIAGNOSIS — I10 Essential (primary) hypertension: Secondary | ICD-10-CM

## 2024-06-02 DIAGNOSIS — N2889 Other specified disorders of kidney and ureter: Secondary | ICD-10-CM | POA: Diagnosis not present

## 2024-06-02 DIAGNOSIS — E119 Type 2 diabetes mellitus without complications: Secondary | ICD-10-CM

## 2024-06-02 DIAGNOSIS — Z6838 Body mass index (BMI) 38.0-38.9, adult: Secondary | ICD-10-CM | POA: Diagnosis not present

## 2024-06-02 DIAGNOSIS — E669 Obesity, unspecified: Secondary | ICD-10-CM

## 2024-06-02 HISTORY — PX: ROBOT ASSISTED LAPAROSCOPIC NEPHRECTOMY: SHX5140

## 2024-06-02 LAB — BASIC METABOLIC PANEL WITH GFR
Anion gap: 14 (ref 5–15)
Anion gap: 13 (ref 5–15)
BUN: 10 mg/dL (ref 8–23)
BUN: 12 mg/dL (ref 8–23)
CO2: 23 mmol/L (ref 22–32)
CO2: 25 mmol/L (ref 22–32)
Calcium: 8.9 mg/dL (ref 8.9–10.3)
Calcium: 9 mg/dL (ref 8.9–10.3)
Chloride: 102 mmol/L (ref 98–111)
Chloride: 101 mmol/L (ref 98–111)
Creatinine, Ser: 0.99 mg/dL (ref 0.44–1.00)
Creatinine, Ser: 1.17 mg/dL — ABNORMAL HIGH (ref 0.44–1.00)
GFR, Estimated: >60 mL/min (ref >60)
GFR, Estimated: 51 mL/min — ABNORMAL LOW (ref >60)
Glucose, Bld: 152 mg/dL — ABNORMAL HIGH (ref 70–99)
Glucose, Bld: 168 mg/dL — ABNORMAL HIGH (ref 70–99)
Potassium: 4.3 mmol/L (ref 3.5–5.1)
Potassium: 3.8 mmol/L (ref 3.5–5.1)
Sodium: 139 mmol/L (ref 135–145)
Sodium: 139 mmol/L (ref 135–145)

## 2024-06-02 LAB — TYPE AND SCREEN
ABO/RH(D): O POS
Antibody Screen: NEGATIVE

## 2024-06-02 LAB — ABO/RH: ABO/RH(D): O POS

## 2024-06-02 LAB — HEMOGLOBIN AND HEMATOCRIT, BLOOD
HCT: 43.3 % (ref 36.0–46.0)
Hemoglobin: 14.4 g/dL (ref 12.0–15.0)

## 2024-06-02 SURGERY — NEPHRECTOMY, RADICAL, ROBOT-ASSISTED, LAPAROSCOPIC, ADULT
Anesthesia: General | Laterality: Left

## 2024-06-02 MED ORDER — LIDOCAINE HCL (PF) 2 % IJ SOLN
INTRAMUSCULAR | Status: DC | PRN
  Administered 2024-06-02: 100 mg via INTRADERMAL

## 2024-06-02 MED ORDER — FENTANYL CITRATE (PF) 250 MCG/5ML IJ SOLN
INTRAMUSCULAR | Status: AC
  Filled 2024-06-02: qty 5

## 2024-06-02 MED ORDER — DROPERIDOL 2.5 MG/ML IJ SOLN: 0.6250 mg | Freq: Once | INTRAMUSCULAR | Status: DC | PRN

## 2024-06-02 MED ORDER — BUPIVACAINE LIPOSOME 1.3 % IJ SUSP
INTRAMUSCULAR | Status: DC | PRN
  Administered 2024-06-02: 20 mL

## 2024-06-02 MED ORDER — PNEUMOCOCCAL 20-VAL CONJ VACC 0.5 ML IM SUSY
0.5000 mL | PREFILLED_SYRINGE | INTRAMUSCULAR | Status: DC
  Filled 2024-06-02: qty 0.5

## 2024-06-02 MED ORDER — DONEPEZIL HCL 10 MG PO TABS
10.0000 mg | ORAL_TABLET | Freq: Every day | ORAL | Status: DC
  Administered 2024-06-03: 10 mg via ORAL
  Filled 2024-06-02: qty 1

## 2024-06-02 MED ORDER — FAMOTIDINE 20 MG PO TABS
40.0000 mg | ORAL_TABLET | Freq: Every day | ORAL | Status: DC
  Administered 2024-06-02: 40 mg via ORAL
  Filled 2024-06-02: qty 2

## 2024-06-02 MED ORDER — HYDROMORPHONE HCL 1 MG/ML IJ SOLN
0.2500 mg | INTRAMUSCULAR | Status: DC | PRN
  Administered 2024-06-02 (×4): 0.5 mg via INTRAVENOUS

## 2024-06-02 MED ORDER — BUPROPION HCL ER (XL) 300 MG PO TB24
300.0000 mg | ORAL_TABLET | Freq: Every morning | ORAL | Status: DC
  Administered 2024-06-03: 300 mg via ORAL
  Filled 2024-06-02: qty 1

## 2024-06-02 MED ORDER — LACTATED RINGERS IV SOLN: INTRAVENOUS | Status: DC

## 2024-06-02 MED ORDER — LIDOCAINE HCL (PF) 2 % IJ SOLN
INTRAMUSCULAR | Status: AC
  Filled 2024-06-02: qty 5

## 2024-06-02 MED ORDER — HYDROMORPHONE HCL 1 MG/ML IJ SOLN: 0.5000 mg | INTRAMUSCULAR | Status: DC | PRN

## 2024-06-02 MED ORDER — EPHEDRINE 5 MG/ML INJ
INTRAVENOUS | Status: AC
Start: 2024-06-02 — End: 2024-06-02
  Filled 2024-06-02: qty 5

## 2024-06-02 MED ORDER — LORATADINE 10 MG PO TABS
10.0000 mg | ORAL_TABLET | Freq: Every day | ORAL | Status: DC
  Administered 2024-06-02 – 2024-06-03 (×2): 10 mg via ORAL
  Filled 2024-06-02 (×2): qty 1

## 2024-06-02 MED ORDER — DOCUSATE SODIUM 100 MG PO CAPS: 100.0000 mg | ORAL_CAPSULE | Freq: Two times a day (BID) | ORAL | 0 refills | Status: AC | PRN

## 2024-06-02 MED ORDER — LIDOCAINE HCL (PF) 2 % IJ SOLN
INTRAMUSCULAR | Status: AC
Start: 2024-06-02 — End: 2024-06-02
  Filled 2024-06-02: qty 5

## 2024-06-02 MED ORDER — HYDROCHLOROTHIAZIDE 25 MG PO TABS
25.0000 mg | ORAL_TABLET | Freq: Every day | ORAL | Status: DC
  Administered 2024-06-03: 25 mg via ORAL
  Filled 2024-06-02: qty 1

## 2024-06-02 MED ORDER — BUPIVACAINE-EPINEPHRINE (PF) 0.5% -1:200000 IJ SOLN
INTRAMUSCULAR | Status: AC
  Filled 2024-06-02: qty 30

## 2024-06-02 MED ORDER — TRAMADOL HCL 50 MG PO TABS: 50.0000 mg | ORAL_TABLET | Freq: Four times a day (QID) | ORAL | Status: DC | PRN

## 2024-06-02 MED ORDER — ROCURONIUM BROMIDE 10 MG/ML (PF) SYRINGE
PREFILLED_SYRINGE | INTRAVENOUS | Status: AC
  Filled 2024-06-02: qty 10

## 2024-06-02 MED ORDER — MIDAZOLAM HCL (PF) 2 MG/2ML IJ SOLN
INTRAMUSCULAR | Status: DC | PRN
  Administered 2024-06-02: 2 mg via INTRAVENOUS

## 2024-06-02 MED ORDER — SIMETHICONE 80 MG PO CHEW
80.0000 mg | CHEWABLE_TABLET | Freq: Four times a day (QID) | ORAL | Status: DC | PRN
Start: 2024-06-02 — End: 2024-06-03

## 2024-06-02 MED ORDER — DEXAMETHASONE SOD PHOSPHATE PF 10 MG/ML IJ SOLN
INTRAMUSCULAR | Status: DC | PRN
  Administered 2024-06-02: 10 mg via INTRAVENOUS

## 2024-06-02 MED ORDER — SODIUM CHLORIDE (PF) 0.9 % IJ SOLN
INTRAMUSCULAR | Status: AC
  Filled 2024-06-02: qty 10

## 2024-06-02 MED ORDER — ROCURONIUM BROMIDE 10 MG/ML (PF) SYRINGE
PREFILLED_SYRINGE | INTRAVENOUS | Status: DC | PRN
  Administered 2024-06-02: 20 mg via INTRAVENOUS
  Administered 2024-06-02: 80 mg via INTRAVENOUS
  Administered 2024-06-02: 20 mg via INTRAVENOUS

## 2024-06-02 MED ORDER — HEMOSTATIC AGENTS (NO CHARGE) OPTIME
TOPICAL | Status: DC | PRN
  Administered 2024-06-02: 1 via TOPICAL

## 2024-06-02 MED ORDER — PHENYLEPHRINE 80 MCG/ML (10ML) SYRINGE FOR IV PUSH (FOR BLOOD PRESSURE SUPPORT)
PREFILLED_SYRINGE | INTRAVENOUS | Status: AC
Start: 2024-06-02 — End: 2024-06-02
  Filled 2024-06-02: qty 10

## 2024-06-02 MED ORDER — OXYCODONE HCL 5 MG PO TABS
5.0000 mg | ORAL_TABLET | Freq: Once | ORAL | Status: AC | PRN
  Administered 2024-06-02: 5 mg via ORAL

## 2024-06-02 MED ORDER — HYDROMORPHONE HCL 1 MG/ML IJ SOLN
INTRAMUSCULAR | Status: AC
  Filled 2024-06-02: qty 1

## 2024-06-02 MED ORDER — BUPIVACAINE LIPOSOME 1.3 % IJ SUSP
INTRAMUSCULAR | Status: AC
  Filled 2024-06-02: qty 20

## 2024-06-02 MED ORDER — MIDAZOLAM HCL 2 MG/2ML IJ SOLN
INTRAMUSCULAR | Status: AC
  Filled 2024-06-02: qty 2

## 2024-06-02 MED ORDER — CEFAZOLIN SODIUM-DEXTROSE 2-4 GM/100ML-% IV SOLN
2.0000 g | INTRAVENOUS | Status: AC
  Administered 2024-06-02: 2 g via INTRAVENOUS
  Filled 2024-06-02: qty 100

## 2024-06-02 MED ORDER — ONDANSETRON HCL 4 MG/2ML IJ SOLN
INTRAMUSCULAR | Status: AC
  Filled 2024-06-02: qty 2

## 2024-06-02 MED ORDER — EPHEDRINE 5 MG/ML INJ
INTRAVENOUS | Status: AC
  Filled 2024-06-02: qty 5

## 2024-06-02 MED ORDER — SERTRALINE HCL 100 MG PO TABS
100.0000 mg | ORAL_TABLET | Freq: Every day | ORAL | Status: DC
  Administered 2024-06-03: 100 mg via ORAL
  Filled 2024-06-02: qty 1

## 2024-06-02 MED ORDER — PROPOFOL 10 MG/ML IV BOLUS
INTRAVENOUS | Status: AC
  Filled 2024-06-02: qty 20

## 2024-06-02 MED ORDER — OXYCODONE HCL 5 MG PO TABS
ORAL_TABLET | ORAL | Status: AC
  Filled 2024-06-02: qty 1

## 2024-06-02 MED ORDER — LACTATED RINGERS IR SOLN
Status: DC | PRN
  Administered 2024-06-02: 1000 mL

## 2024-06-02 MED ORDER — HYDROMORPHONE HCL 2 MG/ML IJ SOLN
INTRAMUSCULAR | Status: AC
  Filled 2024-06-02: qty 1

## 2024-06-02 MED ORDER — OXYCODONE HCL 5 MG/5ML PO SOLN: 5.0000 mg | Freq: Once | ORAL | Status: AC | PRN

## 2024-06-02 MED ORDER — CEFAZOLIN SODIUM-DEXTROSE 1-4 GM/50ML-% IV SOLN
1.0000 g | Freq: Three times a day (TID) | INTRAVENOUS | Status: AC
  Administered 2024-06-02 – 2024-06-03 (×2): 1 g via INTRAVENOUS
  Filled 2024-06-02 (×2): qty 50

## 2024-06-02 MED ORDER — ONDANSETRON HCL 4 MG/2ML IJ SOLN
INTRAMUSCULAR | Status: DC | PRN
  Administered 2024-06-02: 4 mg via INTRAVENOUS

## 2024-06-02 MED ORDER — ONDANSETRON HCL 4 MG/2ML IJ SOLN: 4.0000 mg | INTRAMUSCULAR | Status: DC | PRN

## 2024-06-02 MED ORDER — PHENYLEPHRINE HCL (PRESSORS) 10 MG/ML IV SOLN
INTRAVENOUS | Status: AC
  Filled 2024-06-02: qty 1

## 2024-06-02 MED ORDER — GLYCOPYRROLATE 0.2 MG/ML IJ SOLN
INTRAMUSCULAR | Status: DC | PRN
  Administered 2024-06-02: .2 mg via INTRAVENOUS

## 2024-06-02 MED ORDER — TRAMADOL HCL 50 MG PO TABS: 50.0000 mg | ORAL_TABLET | Freq: Four times a day (QID) | ORAL | 0 refills | Status: AC | PRN

## 2024-06-02 MED ORDER — ACETAMINOPHEN 10 MG/ML IV SOLN
1000.0000 mg | Freq: Four times a day (QID) | INTRAVENOUS | Status: AC
  Administered 2024-06-02 – 2024-06-03 (×4): 1000 mg via INTRAVENOUS
  Filled 2024-06-02 (×4): qty 100

## 2024-06-02 MED ORDER — PHENYLEPHRINE 80 MCG/ML (10ML) SYRINGE FOR IV PUSH (FOR BLOOD PRESSURE SUPPORT)
PREFILLED_SYRINGE | INTRAVENOUS | Status: AC
  Filled 2024-06-02: qty 10

## 2024-06-02 MED ORDER — MIRABEGRON ER 25 MG PO TB24
25.0000 mg | ORAL_TABLET | Freq: Every day | ORAL | Status: DC
  Administered 2024-06-03: 25 mg via ORAL
  Filled 2024-06-02: qty 1

## 2024-06-02 MED ORDER — POLYETHYLENE GLYCOL 3350 17 G PO PACK: 17.0000 g | PACK | Freq: Every day | ORAL | Status: DC | PRN

## 2024-06-02 MED ORDER — SUGAMMADEX SODIUM 200 MG/2ML IV SOLN
INTRAVENOUS | Status: DC | PRN
  Administered 2024-06-02: 400 mg via INTRAVENOUS

## 2024-06-02 MED ORDER — ONDANSETRON HCL 4 MG/2ML IJ SOLN: 4.0000 mg | Freq: Once | INTRAMUSCULAR | Status: DC | PRN

## 2024-06-02 MED ORDER — HYDROMORPHONE HCL 1 MG/ML IJ SOLN
0.5000 mg | Freq: Once | INTRAMUSCULAR | Status: AC
  Administered 2024-06-02: 0.5 mg via INTRAVENOUS

## 2024-06-02 MED ORDER — EPHEDRINE SULFATE-NACL 50-0.9 MG/10ML-% IV SOSY
PREFILLED_SYRINGE | INTRAVENOUS | Status: DC | PRN
  Administered 2024-06-02 (×2): 10 mg via INTRAVENOUS

## 2024-06-02 MED ORDER — CHLORHEXIDINE GLUCONATE 0.12 % MT SOLN
15.0000 mL | Freq: Once | OROMUCOSAL | Status: AC
  Administered 2024-06-02: 15 mL via OROMUCOSAL

## 2024-06-02 MED ORDER — PROPOFOL 10 MG/ML IV BOLUS
INTRAVENOUS | Status: DC | PRN
  Administered 2024-06-02: 150 mg via INTRAVENOUS

## 2024-06-02 MED ORDER — GLYCOPYRROLATE 0.2 MG/ML IJ SOLN
INTRAMUSCULAR | Status: AC
  Filled 2024-06-02: qty 1

## 2024-06-02 MED ORDER — SUGAMMADEX SODIUM 200 MG/2ML IV SOLN
INTRAVENOUS | Status: AC
  Filled 2024-06-02: qty 4

## 2024-06-02 MED ORDER — FENTANYL CITRATE (PF) 250 MCG/5ML IJ SOLN
INTRAMUSCULAR | Status: DC | PRN
  Administered 2024-06-02 (×3): 50 ug via INTRAVENOUS

## 2024-06-02 MED ORDER — ORAL CARE MOUTH RINSE: 15.0000 mL | Freq: Once | OROMUCOSAL | Status: AC

## 2024-06-02 MED ORDER — BUPIVACAINE-EPINEPHRINE 0.5% -1:200000 IJ SOLN
INTRAMUSCULAR | Status: DC | PRN
  Administered 2024-06-02: 14 mL
  Administered 2024-06-02: 16 mL

## 2024-06-02 SURGICAL SUPPLY — 49 items
APPLICATOR SURGIFLO ENDO (HEMOSTASIS) IMPLANT
BAG COUNTER SPONGE SURGICOUNT (BAG) IMPLANT
BAG LAPAROSCOPIC 12 15 PORT 16 (BASKET) ×2 IMPLANT
CHLORAPREP W/TINT 26 (MISCELLANEOUS) ×2 IMPLANT
CLIP LIGATING HEM O LOK PURPLE (MISCELLANEOUS) ×2 IMPLANT
CLIP LIGATING HEMO O LOK GREEN (MISCELLANEOUS) ×2 IMPLANT
COVER SURGICAL LIGHT HANDLE (MISCELLANEOUS) ×2 IMPLANT
COVER TIP SHEARS 8 DVNC (MISCELLANEOUS) ×2 IMPLANT
DERMABOND ADVANCED .7 DNX12 (GAUZE/BANDAGES/DRESSINGS) ×4 IMPLANT
DRAPE ARM DVNC X/XI (DISPOSABLE) ×8 IMPLANT
DRAPE COLUMN DVNC XI (DISPOSABLE) ×2 IMPLANT
DRAPE INCISE IOBAN 66X45 STRL (DRAPES) ×2 IMPLANT
DRAPE LAPAROSCOPIC ABDOMINAL (DRAPES) IMPLANT
DRAPE SHEET LG 3/4 BI-LAMINATE (DRAPES) ×2 IMPLANT
DRIVER NDL LRG 8 DVNC XI (INSTRUMENTS) ×4 IMPLANT
ELECT PENCIL ROCKER SW 15FT (MISCELLANEOUS) IMPLANT
ELECT REM PT RETURN 15FT ADLT (MISCELLANEOUS) ×2 IMPLANT
FORCEPS BPLR LNG DVNC XI (INSTRUMENTS) ×2 IMPLANT
FORCEPS PROGRASP DVNC XI (FORCEP) ×2 IMPLANT
GLOVE BIO SURGEON STRL SZ 6.5 (GLOVE) ×2 IMPLANT
GLOVE SURG LX STRL 7.5 STRW (GLOVE) ×4 IMPLANT
GOWN SRG XL LVL 4 BRTHBL STRL (GOWNS) ×2 IMPLANT
GOWN STRL REUS W/ TWL LRG LVL3 (GOWN DISPOSABLE) ×4 IMPLANT
HEMOSTAT SURGICEL 4X8 (HEMOSTASIS) IMPLANT
HOLDER FOLEY CATH W/STRAP (MISCELLANEOUS) ×2 IMPLANT
IRRIGATION SUCT STRKRFLW 2 WTP (MISCELLANEOUS) IMPLANT
KIT BASIN OR (CUSTOM PROCEDURE TRAY) ×2 IMPLANT
KIT TURNOVER KIT A (KITS) ×2 IMPLANT
NDL INSUFFLATION 14GA 120MM (NEEDLE) IMPLANT
NS IRRIG 1000ML POUR BTL (IV SOLUTION) ×2 IMPLANT
PAD POSITIONING PINK XL (MISCELLANEOUS) ×2 IMPLANT
PROTECTOR NERVE ULNAR (MISCELLANEOUS) ×4 IMPLANT
RELOAD STAPLE 45 2.6 WHT THIN (STAPLE) IMPLANT
SCISSORS MNPLR CVD DVNC XI (INSTRUMENTS) ×2 IMPLANT
SEAL UNIV 5-12 XI (MISCELLANEOUS) ×6 IMPLANT
SET TUBE SMOKE EVAC HIGH FLOW (TUBING) ×2 IMPLANT
SOLUTION ELECTROSURG ANTI STCK (MISCELLANEOUS) ×2 IMPLANT
SPIKE FLUID TRANSFER (MISCELLANEOUS) ×2 IMPLANT
STAPLER POWER ECHELON 45 WIDE (STAPLE) IMPLANT
SURGIFLO W/THROMBIN 8M KIT (HEMOSTASIS) IMPLANT
SUT MNCRL AB 4-0 PS2 18 (SUTURE) ×4 IMPLANT
SUT VIC AB 0 CT1 27XBRD ANTBC (SUTURE) ×2 IMPLANT
SUT VIC AB 2-0 SH 27X BRD (SUTURE) IMPLANT
SUT VICRYL 0 UR6 27IN ABS (SUTURE) IMPLANT
TOWEL OR 17X26 10 PK STRL BLUE (TOWEL DISPOSABLE) ×4 IMPLANT
TRAY FOLEY MTR SLVR 16FR STAT (SET/KITS/TRAYS/PACK) ×2 IMPLANT
TRAY LAPAROSCOPIC (CUSTOM PROCEDURE TRAY) ×2 IMPLANT
TROCAR Z THREAD OPTICAL 12X100 (TROCAR) ×2 IMPLANT
WATER STERILE IRR 1000ML POUR (IV SOLUTION) ×2 IMPLANT

## 2024-06-02 NOTE — Anesthesia Preprocedure Evaluation (Addendum)
 Anesthesia Evaluation  Patient identified by MRN, date of birth, ID band Patient awake    Reviewed: Allergy  & Precautions, NPO status , Patient's Chart, lab work & pertinent test results, reviewed documented beta blocker date and time   Airway Mallampati: II  TM Distance: >3 FB     Dental no notable dental hx. (+) Dental Advisory Given, Teeth Intact, Caps   Pulmonary sleep apnea and Continuous Positive Airway Pressure Ventilation    Pulmonary exam normal breath sounds clear to auscultation       Cardiovascular hypertension, Pt. on medications Normal cardiovascular exam+ Valvular Problems/Murmurs  Rhythm:Regular Rate:Normal     Neuro/Psych  PSYCHIATRIC DISORDERS      ADHDnegative neurological ROS     GI/Hepatic Neg liver ROS,GERD  Medicated,,  Endo/Other  Obesity Pre diabetes  Renal/GU Left renal mass  negative genitourinary   Musculoskeletal negative musculoskeletal ROS (+)    Abdominal  (+) + obese  Peds  Hematology negative hematology ROS (+) Lab Results      Component                Value               Date                      WBC                      5.5                 05/25/2024                HGB                      14.9                05/25/2024                HCT                      44.4                05/25/2024                MCV                      85.1                05/25/2024                PLT                      267                 05/25/2024              Anesthesia Other Findings   Reproductive/Obstetrics                              Anesthesia Physical Anesthesia Plan  ASA: 3  Anesthesia Plan: General   Post-op Pain Management: Dilaudid  IV, Precedex and Tylenol  PO (pre-op)*   Induction: Intravenous  PONV Risk Score and Plan: 4 or greater and Treatment may vary due to age or medical condition, Ondansetron  and Dexamethasone   Airway Management Planned: Oral  ETT  Additional Equipment: None and Arterial line  Intra-op Plan:  Post-operative Plan: Extubation in OR  Informed Consent: I have reviewed the patients History and Physical, chart, labs and discussed the procedure including the risks, benefits and alternatives for the proposed anesthesia with the patient or authorized representative who has indicated his/her understanding and acceptance.     Dental advisory given  Plan Discussed with: CRNA, Anesthesiologist and Surgeon  Anesthesia Plan Comments:          Anesthesia Quick Evaluation

## 2024-06-02 NOTE — H&P (Signed)
 History of present illness: Pt presents for H/P in anticipation of left lap radical nephrectomy by Dr. Cam on 06/02/24. She is doing well and is without complaint.  Pt denies F/C, CP, SOB, N/V, diarrhea/constipation, flank pain, hematuria, and dysuria.  Historical Summary: Left renal mass: found incidentally as part of a workup for cholecystitis. She had a CT scan in 3/25 demonstrating a 3.5 cm left enhancing posterior mid pole renal mass.  Urinary incontinence: Managed with Gemtesa and pelvic floor physical therapy. UDS: Very small bladder capacity, positive stress incontinence, very weak stream, Crede maneuver to empty bladder completely.  Taylor Oconnell has been very successful in managing her symptoms.  Review of systems: A 12 point comprehensive review of systems was obtained and is negative unless otherwise stated in the history of present illness.  Patient Active Problem List   Diagnosis Date Noted   Papilloma of left breast 10/04/2020   Attention deficit hyperactivity disorder (ADHD) 05/26/2018    No current facility-administered medications on file prior to encounter.   Current Outpatient Medications on File Prior to Encounter  Medication Sig Dispense Refill   acetaminophen  (TYLENOL ) 500 MG tablet Take 500-1,000 mg by mouth every 6 (six) hours as needed (pain.).     buPROPion  (WELLBUTRIN  XL) 300 MG 24 hr tablet Take 1 tablet (300 mg total) by mouth every morning. 90 tablet 3   cetirizine (ZYRTEC) 10 MG tablet Take 10 mg by mouth at bedtime.     Cyanocobalamin  (VITAMIN B-12 SL) Place 2,500 mcg under the tongue in the morning.     donepezil  (ARICEPT ) 10 MG tablet Take 1 tablet (10 mg total) by mouth daily. 90 tablet 3   GEMTESA 75 MG TABS Take 75 mg by mouth in the morning.     hydrochlorothiazide  (HYDRODIURIL ) 25 MG tablet Take 25 mg by mouth in the morning.     ramipril (ALTACE) 10 MG capsule Take 10 mg by mouth daily.     sertraline  (ZOLOFT ) 100 MG tablet Take 1 tablet  (100 mg total) by mouth daily. 90 tablet 3   simethicone  (MYLICON) 125 MG chewable tablet Chew 125 mg by mouth every 6 (six) hours as needed for flatulence.      Past Medical History:  Diagnosis Date   GERD (gastroesophageal reflux disease)    Heart murmur    High blood pressure    Pre-diabetes    Sleep apnea     Past Surgical History:  Procedure Laterality Date   ABDOMINAL HYSTERECTOMY     COLONOSCOPY     Cyst excision shoulder     WISDOM TOOTH EXTRACTION      Social History   Tobacco Use   Smoking status: Never   Smokeless tobacco: Never  Vaping Use   Vaping status: Never Used  Substance Use Topics   Alcohol use: Not Currently   Drug use: Never    Family History  Problem Relation Age of Onset   High blood pressure Mother    Heart Problems Father    Bipolar disorder Paternal Grandmother     PE: Vitals:   06/02/24 0943 06/02/24 1007  BP:  (!) 151/74  Pulse:  67  Resp:  16  Temp:  98.6 F (37 C)  TempSrc:  Oral  SpO2:  96%  Weight: 97.7 kg   Height: 5' 3 (1.6 m)    Patient appears to be in no acute distress  patient is alert and oriented x3 Atraumatic normocephalic head No cervical or supraclavicular lymphadenopathy appreciated  No increased work of breathing, no audible wheezes/rhonchi Regular sinus rhythm/rate Abdomen is soft, nontender, nondistended, no CVA or suprapubic tenderness Lower extremities are symmetric without appreciable edema Grossly neurologically intact No identifiable skin lesions  No results for input(s): WBC, HGB, HCT in the last 72 hours. No results for input(s): NA, K, CL, CO2, GLUCOSE, BUN, CREATININE, CALCIUM in the last 72 hours. No results for input(s): LABPT, INR in the last 72 hours. No results for input(s): LABURIN in the last 72 hours. No results found for this or any previous visit.  Imaging: none  Imp/ Recommendations: The patient's renal mass has grown noticeably over the last 6  months. In addition, it is very centrally located. It appears to have a necrotic center.  I went through the images with the patient and her husband. We discussed the fact that it was centrally located and would be very hard to remove just the tumor itself and leave the kidney in a functional way. As result, I recommended that we perform a nephrectomy. I went through the surgery with her in detail. 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 I did tell her that she would lose kidney function and she needed to protect it by maintaining excellent control of her hypertension and diabetes. She seemed to have a good understanding and will get the surgery scheduled for soon.   Taylor Oconnell

## 2024-06-02 NOTE — Discharge Instructions (Signed)
  Driving:  It is against the law to drive when taking narcotic pain medications.  You should wait at least 8 hours after taking your last pain pill before driving.  Further, you should not drive if you are to sore to react quickly or if you have something impeding your ability to drive.   Activity:  You are encouraged to ambulate frequently (about every hour during waking hours) to help prevent blood clots from forming in your legs or lungs.  However, you should not engage in any heavy lifting (> 10-15 lbs), strenuous activity, or straining.  Diet: You should advance your diet as instructed by your physician.  It will be normal to have some bloating, nausea, and abdominal discomfort intermittently.  Prescriptions:  You will be provided a prescription for pain medication to take as needed.  If your pain is not severe enough to require the prescription pain medication, you may take extra strength Tylenol  instead which will have less side effects.  You should also take a prescribed stool softener to avoid straining with bowel movements as the prescription pain medication may constipate you.  Incisions: You may remove your dressing bandages 48 hours after surgery if not removed in the hospital.  You will either have some small staples or special tissue glue at each of the incision sites. Once the bandages are removed (if present), the incisions may stay open to air.  You may start showering (but not soaking or bathing in water) the 2nd day after surgery and the incisions simply need to be patted dry after the shower.  No additional care is needed.  What to call us  about: You should call the office [phone]) if you develop fever > 101 or develop persistent vomiting. Activity:  You are encouraged to ambulate frequently (about every hour during waking hours) to help prevent blood clots from forming in your legs or lungs.  However, you should not engage in any heavy lifting (> 10-15 lbs), strenuous activity,  or straining.

## 2024-06-02 NOTE — Anesthesia Procedure Notes (Signed)
 Procedure Name: Intubation Date/Time: 06/02/2024 11:36 AM  Performed by: Obadiah Reyes BROCKS, CRNAPre-anesthesia Checklist: Patient identified, Emergency Drugs available, Suction available and Patient being monitored Patient Re-evaluated:Patient Re-evaluated prior to induction Oxygen Delivery Method: Circle System Utilized Preoxygenation: Pre-oxygenation with 100% oxygen Induction Type: IV induction Ventilation: Mask ventilation without difficulty Laryngoscope Size: Miller and 2 Grade View: Grade I Tube type: Oral Tube size: 7.0 mm Number of attempts: 1 Airway Equipment and Method: Stylet and Oral airway Placement Confirmation: ETT inserted through vocal cords under direct vision, positive ETCO2 and breath sounds checked- equal and bilateral Secured at: 20 cm Tube secured with: Tape Dental Injury: Teeth and Oropharynx as per pre-operative assessment

## 2024-06-02 NOTE — Op Note (Addendum)
 Preoperative diagnosis:  Left renal mass    Postoperative diagnosis:  Same    Procedure: Robotic assisted laparoscopic left nephrectomy   Surgeon: Morene MICAEL Salines, MD 1st assistant: Maurilio BROCKS. Norva, MD    Anesthesia: General   Complications: None   Intraoperative findings:  #1. Two renal artery branches, 2 renal vein branches #2. Left kidney removed en bloc #3. Adrenal gland spared.  EBL: 25 cc    Specimens: left kidney   Indication: Taylor Oconnell is a 68 yo female patient with left renal mass.  After reviewing the management options for treatment, she elected to proceed with the above surgical procedure(s). We have discussed the potential benefits and risks of the procedure, side effects of the proposed treatment, the likelihood of the patient achieving the goals of the procedure, and any potential problems that might occur during the procedure or recuperation. Informed consent has been obtained.   Description of procedure:   The patient was taken to the operating room and a general anesthetic was administered. The patient was given preoperative antibiotics, placed in the right modified flank position with care to pad all potential pressure points, and prepped and draped in the usual sterile fashion. Next a preoperative timeout was performed.   A site was selected on the left side of the umbilicus for placement of the camera port. This was placed using a standard modified Hassan technique with entry into the peritoneum with a 8 mm tocar. We entered the peritoneum without incident and established pneumoperitoneum.  The camera was then used to inspect the abdomen and there was no evidence of any intra-abdominal injuries or other abnormalities. The remaining abdominal ports were then placed. 8 mm robotic ports were placed in the left upper quadrant, left lower quadrant, and left lateral abdominal wall, making a soft J. A 12 mm port was placed in the upper midline for laparoscopic  assistance. All ports were placed under direct vision without difficulty. The robot was then docked.    Utilizing the cautery scissors, the white line of Toldt was incised allowing the colon to be mobilized medially and the plane between the mesocolon and the anterior layer of Gerota's fascia to be developed and the kidney to be exposed.  The ureter and gonadal vein were identified inferiorly and the ureter was lifted anteriorly off the psoas muscle.  Dissection proceeded superiorly along the gonadal vein until the renal vein was identified.  The renal hilum was then carefully isolated with a combination of blunt and sharp dissection allowing the renal arterial and venous structures to be separated and isolated. The renal artery was then stapled, followed by the renal vein using a vascular staple load.   The kidney was then freed from remaining superior and lateral attachments using electrocautery. The adrenal gland was spared. Surgicel was laid in the renal fossa, which was then inspected; hemostasis appeared adequate. The kidney was placed into an endocatch retrieval bag.   The surgical robotic cart was undocked. The camera port site was then closed at the fascial level with 0-vicryl suture.  All other laparoscopic/robotic ports were removed under direct vision and the pneumoperitoneum let down with inspection of the operative field performed and hemostasis again confirmed. All incision sites were then injected with local anesthetic and reapproximated at the skin level with 4-0 monocryl subcuticular closures. The midline incision was then extended superiorly and inferiorly to allow the specimen to be extracted. The fascia was then closed with 0-PDS in a running fashion from both apices  and tied in the middle. Scarpa's fascia was closed with 0-vicryl and skin was closed with 4-0 monocryl in a running subcuticular fashion. Dermabond was applied to the skin.  The patient tolerated the procedure well and without  complications.  The patient was able to be extubated and transferred to the recovery unit in satisfactory condition.   Morene MICAEL Salines, M.D.

## 2024-06-02 NOTE — Anesthesia Postprocedure Evaluation (Signed)
 Anesthesia Post Note  Patient: Taylor Oconnell  Procedure(s) Performed: NEPHRECTOMY, RADICAL, ROBOT-ASSISTED, LAPAROSCOPIC, ADULT (Left)     Patient location during evaluation: PACU Anesthesia Type: General Level of consciousness: awake and alert and oriented Pain management: pain level controlled Vital Signs Assessment: post-procedure vital signs reviewed and stable Respiratory status: spontaneous breathing, nonlabored ventilation, respiratory function stable and patient connected to face mask oxygen Cardiovascular status: blood pressure returned to baseline and stable Postop Assessment: no apparent nausea or vomiting Anesthetic complications: no   No notable events documented.  Last Vitals:  Vitals:   06/02/24 1500 06/02/24 1515  BP:  (!) 187/85  Pulse: 64 66  Resp: 15 15  Temp:    SpO2: 100% 100%    Last Pain:  Vitals:   06/02/24 1515  TempSrc:   PainSc: 7                  [name] A.

## 2024-06-02 NOTE — Progress Notes (Signed)
   06/02/24 2240  BiPAP/CPAP/SIPAP  $ Non-Invasive Home Ventilator  Initial  BiPAP/CPAP/SIPAP Pt Type Adult  BiPAP/CPAP/SIPAP Resmed  Mask Type Nasal pillows  Dentures removed? Not applicable  Mask Size Small  Flow Rate 2.5 lpm  Patient Home Machine No  Patient Home Mask Yes  Patient Home Tubing Yes  Auto Titrate Yes  Minimum cmH2O 5 cmH2O  Maximum cmH2O 20 cmH2O  CPAP/SIPAP surface wiped down Yes  Device Plugged into RED Power Outlet Yes  BiPAP/CPAP /SiPAP Vitals  Bilateral Breath Sounds Clear;Diminished

## 2024-06-02 NOTE — Transfer of Care (Signed)
 Immediate Anesthesia Transfer of Care Note  Patient: Taylor Oconnell  Procedure(s) Performed: NEPHRECTOMY, RADICAL, ROBOT-ASSISTED, LAPAROSCOPIC, ADULT (Left)  Patient Location: PACU  Anesthesia Type:General  Level of Consciousness: sedated and responds to stimulation  Airway & Oxygen Therapy: Patient Spontanous Breathing and Patient connected to face mask oxygen  Post-op Assessment: Report given to RN and Post -op Vital signs reviewed and stable  Post vital signs: Reviewed and stable  Last Vitals:  Vitals Value Taken Time  BP 141/79 06/02/24 14:36  Temp    Pulse 60 06/02/24 14:39  Resp 13 06/02/24 14:39  SpO2 100 % 06/02/24 14:39  Vitals shown include unfiled device data.  Last Pain:  Vitals:   06/02/24 1007  TempSrc: Oral  PainSc:          Complications: No notable events documented.

## 2024-06-02 NOTE — Anesthesia Procedure Notes (Signed)
 Arterial Line Insertion Start/End11/26/2025 11:36 AM, 06/02/2024 11:40 AM Performed by: Cena Epps, CRNA  Preanesthetic checklist: patient identified, IV checked, site marked, risks and benefits discussed, surgical consent, monitors and equipment checked, pre-op evaluation and timeout performed Patient sedated radial was placed Catheter size: 20 G Hand hygiene performed , maximum sterile barriers used  and Seldinger technique used Allen's test indicative of satisfactory collateral circulation Attempts: 1 Following insertion, dressing applied and Biopatch. Post procedure assessment: normal  Patient tolerated the procedure well with no immediate complications.

## 2024-06-03 ENCOUNTER — Encounter (HOSPITAL_COMMUNITY): Payer: Self-pay | Admitting: Urology

## 2024-06-03 LAB — BASIC METABOLIC PANEL WITH GFR
Anion gap: 9 (ref 5–15)
BUN: 14 mg/dL (ref 8–23)
CO2: 28 mmol/L (ref 22–32)
Calcium: 9 mg/dL (ref 8.9–10.3)
Chloride: 101 mmol/L (ref 98–111)
Creatinine, Ser: 1.24 mg/dL — ABNORMAL HIGH (ref 0.44–1.00)
GFR, Estimated: 47 mL/min — ABNORMAL LOW (ref >60)
Glucose, Bld: 105 mg/dL — ABNORMAL HIGH (ref 70–99)
Potassium: 3.6 mmol/L (ref 3.5–5.1)
Sodium: 138 mmol/L (ref 135–145)

## 2024-06-03 LAB — HEMOGLOBIN AND HEMATOCRIT, BLOOD
HCT: 39.2 % (ref 36.0–46.0)
Hemoglobin: 13.2 g/dL (ref 12.0–15.0)

## 2024-06-03 LAB — HIV ANTIBODY (ROUTINE TESTING W REFLEX): HIV Screen 4th Generation wRfx: NONREACTIVE

## 2024-06-03 MED ORDER — ORAL CARE MOUTH RINSE: 15.0000 mL | OROMUCOSAL | Status: DC | PRN

## 2024-06-03 NOTE — Discharge Summary (Signed)
 Date of admission: 06/02/2024  Date of discharge: 06/03/2024  Admission diagnosis: left renal mass  Discharge diagnosis: same  Secondary diagnoses:  Patient Active Problem List   Diagnosis Date Noted   Renal mass, left 06/02/2024   Papilloma of left breast 10/04/2020   Attention deficit hyperactivity disorder (ADHD) 05/26/2018    Procedures performed: Procedure(s): NEPHRECTOMY, RADICAL, ROBOT-ASSISTED, LAPAROSCOPIC, ADULT  History and Physical: For full details, please see admission history and physical. Briefly, Taylor Oconnell is a 68 y.o. year old patient with large endophytic renal mass.   Hospital Course: Patient tolerated the procedure well.  She was then transferred to the floor after an uneventful PACU stay.  Her hospital course was uncomplicated.  On POD#1 she had met discharge criteria: was eating a regular diet, was up and ambulating independently,  pain was well controlled, was voiding without a catheter, and was ready to for discharge.  PE on day of discharge: NAD Vitals:   06/03/24 0303 06/03/24 0652  BP: (!) 142/64 (!) 140/60  Pulse: 62 65  Resp: 15 18  Temp: 98 F (36.7 C) 98.2 F (36.8 C)  SpO2: 96% 97%    Intake/Output Summary (Last 24 hours) at 06/03/2024 0801 Last data filed at 06/03/2024 9388 Gross per 24 hour  Intake 2358.65 ml  Output 650 ml  Net 1708.65 ml   Non-labored breathing Incisions are c/d/I - mild brusing  Abdomen is soft Ext are symmetric  Recent Labs    06/02/24 1602 06/03/24 0451  HGB 14.4 13.2  HCT 43.3 39.2   Recent Labs    06/02/24 1445 06/02/24 1936 06/03/24 0451  NA 139 139 138  K 4.3 3.8 3.6  CL 102 101 101  CO2 23 25 28   GLUCOSE 152* 168* 105*  BUN 10 12 14   CREATININE 0.99 1.17* 1.24*  CALCIUM 8.9 9.0 9.0   No results for input(s): LABPT, INR in the last 72 hours. No results for input(s): PSA in the last 72 hours. No results for input(s): LABURIN in the last 72 hours. No results found for this  or any previous visit.     Laboratory values:  Recent Labs    06/02/24 1602 06/03/24 0451  HGB 14.4 13.2  HCT 43.3 39.2   Recent Labs    06/02/24 1445 06/02/24 1936 06/03/24 0451  NA 139 139 138  K 4.3 3.8 3.6  CL 102 101 101  CO2 23 25 28   GLUCOSE 152* 168* 105*  BUN 10 12 14   CREATININE 0.99 1.17* 1.24*  CALCIUM 8.9 9.0 9.0   No results for input(s): LABPT, INR in the last 72 hours. No results for input(s): LABURIN in the last 72 hours. No results found for this or any previous visit.  Disposition: Home  Discharge instruction: The patient was instructed to be ambulatory but told to refrain from heavy lifting, strenuous activity, or driving.   Discharge medications:  Allergies as of 06/03/2024       Reactions   Sulfamethoxazole Swelling        Medication List     TAKE these medications    acetaminophen  500 MG tablet Commonly known as: TYLENOL  Take 500-1,000 mg by mouth every 6 (six) hours as needed (pain.).   buPROPion  300 MG 24 hr tablet Commonly known as: WELLBUTRIN  XL Take 1 tablet (300 mg total) by mouth every morning.   cetirizine 10 MG tablet Commonly known as: ZYRTEC Take 10 mg by mouth at bedtime.   docusate sodium  100 MG capsule  Commonly known as: COLACE Take 1 capsule (100 mg total) by mouth 2 (two) times daily as needed (take to keep stool soft.).   donepezil  10 MG tablet Commonly known as: ARICEPT  Take 1 tablet (10 mg total) by mouth daily.   famotidine  40 MG tablet Commonly known as: PEPCID  Take 1 tablet (40 mg total) by mouth at bedtime.   Gemtesa 75 MG Tabs Generic drug: Vibegron Take 75 mg by mouth in the morning.   hydrochlorothiazide  25 MG tablet Commonly known as: HYDRODIURIL  Take 25 mg by mouth in the morning.   ramipril 10 MG capsule Commonly known as: ALTACE Take 10 mg by mouth daily.   sertraline  100 MG tablet Commonly known as: ZOLOFT  Take 1 tablet (100 mg total) by mouth daily.   simethicone  125 MG  chewable tablet Commonly known as: MYLICON Chew 125 mg by mouth every 6 (six) hours as needed for flatulence.   traMADol  50 MG tablet Commonly known as: Ultram  Take 1-2 tablets (50-100 mg total) by mouth every 6 (six) hours as needed for moderate pain (pain score 4-6).   VITAMIN B-12 SL Place 2,500 mcg under the tongue in the morning.        Followup:   Follow-up Information     Cam Morene ORN, MD Follow up in 2 week(s).   Specialty: Urology Why: For wound re-check Contact information: [address] [place] [address] [phone]

## 2024-06-03 NOTE — Plan of Care (Signed)

## 2024-06-04 LAB — SURGICAL PATHOLOGY

## 2024-07-05 ENCOUNTER — Other Ambulatory Visit: Payer: Self-pay | Admitting: Allergy and Immunology

## 2024-10-12 ENCOUNTER — Ambulatory Visit: Admitting: Psychiatry

## 2024-11-08 ENCOUNTER — Institutional Professional Consult (permissible substitution): Payer: Self-pay | Admitting: Psychology

## 2024-11-08 ENCOUNTER — Ambulatory Visit

## 2024-11-15 ENCOUNTER — Encounter: Admitting: Psychology
